# Patient Record
Sex: Male | Born: 1965 | Race: Black or African American | Hispanic: No | Marital: Married | Smoking: Never smoker
Health system: Southern US, Community
[De-identification: ages and names within clinical notes are randomized; demographics above are authoritative.]

## PROBLEM LIST (undated history)

## (undated) DIAGNOSIS — T7840XA Allergy, unspecified, initial encounter: Secondary | ICD-10-CM

## (undated) DIAGNOSIS — I10 Essential (primary) hypertension: Secondary | ICD-10-CM

## (undated) DIAGNOSIS — I4891 Unspecified atrial fibrillation: Secondary | ICD-10-CM

## (undated) DIAGNOSIS — K219 Gastro-esophageal reflux disease without esophagitis: Secondary | ICD-10-CM

## (undated) HISTORY — DX: Gastro-esophageal reflux disease without esophagitis: K21.9

## (undated) HISTORY — PX: STOMACH SURGERY: SHX791

## (undated) HISTORY — DX: Allergy, unspecified, initial encounter: T78.40XA

---

## 2000-08-23 ENCOUNTER — Emergency Department (HOSPITAL_COMMUNITY): Admission: EM | Admit: 2000-08-23 | Discharge: 2000-08-23 | Payer: Self-pay | Admitting: Emergency Medicine

## 2003-08-17 ENCOUNTER — Ambulatory Visit (HOSPITAL_COMMUNITY): Admission: RE | Admit: 2003-08-17 | Discharge: 2003-08-17 | Payer: Self-pay | Admitting: Family Medicine

## 2015-02-20 ENCOUNTER — Ambulatory Visit (INDEPENDENT_AMBULATORY_CARE_PROVIDER_SITE_OTHER): Payer: 59

## 2015-02-20 ENCOUNTER — Ambulatory Visit (INDEPENDENT_AMBULATORY_CARE_PROVIDER_SITE_OTHER): Payer: 59 | Admitting: Podiatry

## 2015-02-20 ENCOUNTER — Encounter: Payer: Self-pay | Admitting: Podiatry

## 2015-02-20 VITALS — BP 140/70 | HR 78 | Resp 13 | Ht 72.0 in | Wt 275.0 lb

## 2015-02-20 DIAGNOSIS — B079 Viral wart, unspecified: Secondary | ICD-10-CM | POA: Diagnosis not present

## 2015-02-20 DIAGNOSIS — B078 Other viral warts: Secondary | ICD-10-CM

## 2015-02-20 DIAGNOSIS — M205X1 Other deformities of toe(s) (acquired), right foot: Secondary | ICD-10-CM | POA: Diagnosis not present

## 2015-02-20 DIAGNOSIS — Q667 Congenital pes cavus: Secondary | ICD-10-CM

## 2015-02-20 DIAGNOSIS — M216X9 Other acquired deformities of unspecified foot: Secondary | ICD-10-CM

## 2015-02-20 NOTE — Progress Notes (Signed)
   Subjective:    Patient ID: Terry Delgado, male    DOB: Dec 31, 1965, 49 y.o.   MRN: [id number]  HPI    Review of Systems  HENT: Positive for congestion, hearing loss and sinus pressure.   Respiratory: Positive for shortness of breath.        Shortness of breath with increase of wt.  All other systems reviewed and are negative.      Objective:   Physical Exam        Assessment & Plan:

## 2015-02-21 NOTE — Progress Notes (Signed)
Subjective:     Patient ID: Terry Delgado, male   DOB: 1966/08/17, 49 y.o.   MRN: [id number]  HPI patient presents with lesions on the plantar aspect of the right foot on the lateral side that been painful and making it hard for him to walk. States he's tried to trim them and other modalities without relief of symptoms   Review of Systems  All other systems reviewed and are negative.      Objective:   Physical Exam  Constitutional: He is oriented to person, place, and time.  Cardiovascular: Intact distal pulses.   Musculoskeletal: Normal range of motion.  Neurological: He is oriented to person, place, and time.  Skin: Skin is warm.  Nursing note and vitals reviewed.  neurovascular status intact muscle strength adequate with range of motion subtalar and midtarsal joint within normal limits. Patient's noted to have keratotic lesion plantar aspect right foot lateral side that upon debridement shows pinpoint bleeding and pain to lateral pressure. Digital perfusion is normal and patient well oriented 3     Assessment:     Probable verruca plantaris plantar lateral aspect right foot measuring 8 x 8 mm    Plan:     H&P and x-rays reviewed showing a lesion to be on the metatarsal head. Deep debridement accomplished with chemical application tolerated well and sterile dressing applied. Explain what to do if any other issues should occur and reappoint 4 weeks

## 2015-02-27 ENCOUNTER — Ambulatory Visit: Payer: 59 | Admitting: Podiatry

## 2015-11-27 ENCOUNTER — Emergency Department (HOSPITAL_COMMUNITY): Payer: Managed Care, Other (non HMO)

## 2015-11-27 ENCOUNTER — Encounter (HOSPITAL_COMMUNITY): Payer: Self-pay | Admitting: Emergency Medicine

## 2015-11-27 ENCOUNTER — Inpatient Hospital Stay (HOSPITAL_COMMUNITY)
Admission: EM | Admit: 2015-11-27 | Discharge: 2015-12-02 | DRG: 390 | Disposition: A | Discharge status: Home / Self Care | Payer: Managed Care, Other (non HMO) | Attending: Surgery | Admitting: Surgery

## 2015-11-27 DIAGNOSIS — Z79899 Other long term (current) drug therapy: Secondary | ICD-10-CM | POA: Diagnosis not present

## 2015-11-27 DIAGNOSIS — Z880 Allergy status to penicillin: Secondary | ICD-10-CM

## 2015-11-27 DIAGNOSIS — K219 Gastro-esophageal reflux disease without esophagitis: Secondary | ICD-10-CM | POA: Diagnosis present

## 2015-11-27 DIAGNOSIS — K56609 Unspecified intestinal obstruction, unspecified as to partial versus complete obstruction: Secondary | ICD-10-CM

## 2015-11-27 DIAGNOSIS — K5669 Other intestinal obstruction: Secondary | ICD-10-CM | POA: Diagnosis present

## 2015-11-27 DIAGNOSIS — K565 Intestinal adhesions [bands] with obstruction (postprocedural) (postinfection): Principal | ICD-10-CM | POA: Diagnosis present

## 2015-11-27 LAB — URINALYSIS, ROUTINE W REFLEX MICROSCOPIC
Bilirubin Urine: NEGATIVE
Glucose, UA: NEGATIVE mg/dL
Hgb urine dipstick: NEGATIVE
Ketones, ur: NEGATIVE mg/dL
Leukocytes, UA: NEGATIVE
Nitrite: NEGATIVE
Protein, ur: NEGATIVE mg/dL
Specific Gravity, Urine: 1.021 (ref 1.005–1.030)
pH: 7 (ref 5.0–8.0)

## 2015-11-27 LAB — CREATININE, SERUM: CREATININE: 1.13 mg/dL (ref 0.61–1.24)

## 2015-11-27 LAB — LIPASE, BLOOD: Lipase: 26 U/L (ref 11–51)

## 2015-11-27 LAB — CBC
HCT: 45.9 % (ref 39.0–52.0)
Hemoglobin: 14.9 g/dL (ref 13.0–17.0)
MCH: 27.3 pg (ref 26.0–34.0)
MCHC: 32.5 g/dL (ref 30.0–36.0)
MCV: 84.2 fL (ref 78.0–100.0)
Platelets: 198 10*3/uL (ref 150–400)
RBC: 5.45 MIL/uL (ref 4.22–5.81)
RDW: 13.3 % (ref 11.5–15.5)
WBC: 10.7 10*3/uL — ABNORMAL HIGH (ref 4.0–10.5)

## 2015-11-27 LAB — COMPREHENSIVE METABOLIC PANEL
ALT: 28 U/L (ref 17–63)
AST: 24 U/L (ref 15–41)
Albumin: 4 g/dL (ref 3.5–5.0)
Alkaline Phosphatase: 92 U/L (ref 38–126)
Anion gap: 7 (ref 5–15)
BUN: 9 mg/dL (ref 6–20)
CO2: 27 mmol/L (ref 22–32)
Calcium: 9.4 mg/dL (ref 8.9–10.3)
Chloride: 104 mmol/L (ref 101–111)
Creatinine, Ser: 1.02 mg/dL (ref 0.61–1.24)
GFR calc Af Amer: >60 mL/min (ref >60)
GFR calc non Af Amer: >60 mL/min (ref >60)
Glucose, Bld: 142 mg/dL — ABNORMAL HIGH (ref 65–99)
Potassium: 4.8 mmol/L (ref 3.5–5.1)
Sodium: 138 mmol/L (ref 135–145)
Total Bilirubin: 0.9 mg/dL (ref 0.3–1.2)
Total Protein: 7.8 g/dL (ref 6.5–8.1)

## 2015-11-27 MED ORDER — SODIUM CHLORIDE 0.9 % IV SOLN
1000.0000 mL | Freq: Once | INTRAVENOUS | Status: AC
  Administered 2015-11-27: 1000 mL via INTRAVENOUS

## 2015-11-27 MED ORDER — ONDANSETRON HCL 4 MG/2ML IJ SOLN
4.0000 mg | Freq: Four times a day (QID) | INTRAMUSCULAR | Status: DC | PRN
  Administered 2015-11-27: 4 mg via INTRAVENOUS

## 2015-11-27 MED ORDER — ONDANSETRON HCL 4 MG/2ML IJ SOLN
4.0000 mg | Freq: Four times a day (QID) | INTRAMUSCULAR | Status: DC | PRN
Start: 2015-11-27 — End: 2015-11-27
  Administered 2015-11-27: 4 mg via INTRAVENOUS
  Filled 2015-11-27 (×2): qty 2

## 2015-11-27 MED ORDER — HEPARIN SODIUM (PORCINE) 5000 UNIT/ML IJ SOLN
5000.0000 [IU] | Freq: Three times a day (TID) | INTRAMUSCULAR | Status: DC
  Filled 2015-11-27 (×3): qty 1

## 2015-11-27 MED ORDER — IOHEXOL 300 MG/ML  SOLN
50.0000 mL | Freq: Once | INTRAMUSCULAR | Status: AC | PRN
  Administered 2015-11-27: 50 mL via ORAL

## 2015-11-27 MED ORDER — DEXTROSE-NACL 5-0.9 % IV SOLN
INTRAVENOUS | Status: DC
  Administered 2015-11-27 – 2015-11-29 (×3): via INTRAVENOUS

## 2015-11-27 MED ORDER — DIPHENHYDRAMINE HCL 25 MG PO CAPS: 25.0000 mg | ORAL_CAPSULE | Freq: Four times a day (QID) | ORAL | Status: DC | PRN

## 2015-11-27 MED ORDER — IOPAMIDOL (ISOVUE-300) INJECTION 61%
100.0000 mL | Freq: Once | INTRAVENOUS | Status: AC | PRN
  Administered 2015-11-27: 100 mL via INTRAVENOUS

## 2015-11-27 MED ORDER — HYDROMORPHONE HCL 1 MG/ML IJ SOLN
1.0000 mg | Freq: Once | INTRAMUSCULAR | Status: AC
  Administered 2015-11-27: 1 mg via INTRAVENOUS
  Filled 2015-11-27: qty 1

## 2015-11-27 MED ORDER — PANTOPRAZOLE SODIUM 40 MG IV SOLR
40.0000 mg | Freq: Two times a day (BID) | INTRAVENOUS | Status: DC
  Administered 2015-11-27 – 2015-12-01 (×10): 40 mg via INTRAVENOUS
  Filled 2015-11-27 (×12): qty 40

## 2015-11-27 MED ORDER — MORPHINE SULFATE (PF) 2 MG/ML IV SOLN
2.0000 mg | INTRAVENOUS | Status: DC | PRN
  Administered 2015-11-27: 2 mg via INTRAVENOUS
  Administered 2015-11-27 (×2): 4 mg via INTRAVENOUS
  Administered 2015-11-28: 2 mg via INTRAVENOUS
  Filled 2015-11-27 (×3): qty 2
  Filled 2015-11-27: qty 1

## 2015-11-27 MED ORDER — ENOXAPARIN SODIUM 60 MG/0.6ML ~~LOC~~ SOLN
60.0000 mg | SUBCUTANEOUS | Status: DC
Start: 2015-11-27 — End: 2015-11-30
  Administered 2015-11-27 – 2015-11-29 (×3): 60 mg via SUBCUTANEOUS
  Filled 2015-11-27 (×5): qty 0.6

## 2015-11-27 MED ORDER — SODIUM CHLORIDE 0.9 % IV SOLN
1000.0000 mL | INTRAVENOUS | Status: DC
  Administered 2015-11-27: 1000 mL via INTRAVENOUS

## 2015-11-27 MED ORDER — IOHEXOL 300 MG/ML  SOLN: 100.0000 mL | Freq: Once | INTRAMUSCULAR | Status: DC | PRN

## 2015-11-27 MED ORDER — DIPHENHYDRAMINE HCL 50 MG/ML IJ SOLN: 25.0000 mg | Freq: Four times a day (QID) | INTRAMUSCULAR | Status: DC | PRN

## 2015-11-27 MED ORDER — ONDANSETRON 4 MG PO TBDP: 4.0000 mg | ORAL_TABLET | Freq: Four times a day (QID) | ORAL | Status: DC | PRN

## 2015-11-27 NOTE — ED Notes (Signed)
FIRST ATTEMPT TO CALL REPORT TO 1513-1.

## 2015-11-27 NOTE — H&P (Signed)
Terry Delgado is an 50 y.o. male.   Chief Complaint: crampy lower abdominal pain HPI: Pt presents to the ED with abdominal pain last PM.  He reported to the nurse he was on a 7 day cleanse that started on Saturday.  He started having pain last PM around 7 PM, he thought it was GERD.  His pain got worse around 9 PM.  He ate a smoothy, and then some soup with noodles.   No nausea, vomiting, fever or chills.  He has had similar episodes in the past.  Best recollection 2 over the last 20 years.    Work up show he is afebrile, VSS, labs are all normal.  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  He had some kind of abdominal surgery as an infant.   Noodles from last night are in the cannister after NG placement by the ED.  He has about 500 total currently in cannister.  We are ask to see    Past Medical History  Diagnosis Date   Probable prior SBO's  x 2 over the last 20 years  . GERD (gastroesophageal reflux disease)   . Allergy     Past Surgical History  Procedure Laterality Date  . Stomach surgery [place], [place]; as an infant       infant    History reviewed. No pertinent family history. Social History:  reports that he has never smoked. He does not have any smokeless tobacco history on file. He reports that he does not use illicit drugs. His alcohol history is not on file.  Allergies:  Allergies  Allergen Reactions  . Penicillins Rash    Has patient had a PCN reaction causing immediate rash, facial/tongue/throat swelling, SOB or lightheadedness with hypotension: unknown Has patient had a PCN reaction causing severe rash involving mucus membranes or skin necrosis: No Has patient had a PCN reaction that required hospitalization No Has patient had a PCN reaction occurring within the last 10 years: No If all of the above answers are "NO", then may proceed with Cephalosporin use.    Prior to Admission medications   Medication Sig Start Date End Date Taking? Authorizing Provider   calcium carbonate (TUMS EX) 750 MG chewable tablet Chew 2 tablets by mouth 2 (two) times daily.   Yes Historical Provider, MD  fexofenadine (ALLEGRA) 30 MG tablet Take 30 mg by mouth daily as needed (seasonal allergies). Reported on 11/27/2015   Yes Historical Provider, MD  fluticasone (FLONASE) 50 MCG/ACT nasal spray Place 1 spray into both nostrils daily as needed for allergies or rhinitis.   Yes Historical Provider, MD  ibuprofen (ADVIL,MOTRIN) 200 MG tablet Take 400 mg by mouth daily as needed for mild pain.   Yes Historical Provider, MD  ranitidine (ZANTAC) 75 MG tablet Take 75 mg by mouth 2 (two) times daily.   Yes Historical Provider, MD     Results for orders placed or performed during the hospital encounter of 11/27/15 (from the past 48 hour(s))  Lipase, blood     Status: None   Collection Time: 11/27/15  7:52 AM  Result Value Ref Range   Lipase 26 11 - 51 U/L  Comprehensive metabolic panel     Status: Abnormal   Collection Time: 11/27/15  7:52 AM  Result Value Ref Range   Sodium 138 135 - 145 mmol/L   Potassium 4.8 3.5 - 5.1 mmol/L   Chloride 104 101 - 111 mmol/L   CO2 27 22 - 32  mmol/L   Glucose, Bld 142 (H) 65 - 99 mg/dL   BUN 9 6 - 20 mg/dL   Creatinine, Ser 1.02 0.61 - 1.24 mg/dL   Calcium 9.4 8.9 - 10.3 mg/dL   Total Protein 7.8 6.5 - 8.1 g/dL   Albumin 4.0 3.5 - 5.0 g/dL   AST 24 15 - 41 U/L   ALT 28 17 - 63 U/L   Alkaline Phosphatase 92 38 - 126 U/L   Total Bilirubin 0.9 0.3 - 1.2 mg/dL   GFR calc non Af Amer >60 >60 mL/min   GFR calc Af Amer >60 >60 mL/min    Comment: (NOTE) The eGFR has been calculated using the CKD EPI equation. This calculation has not been validated in all clinical situations. eGFR's persistently <60 mL/min signify possible Chronic Kidney Disease.    Anion gap 7 5 - 15  CBC     Status: Abnormal   Collection Time: 11/27/15  7:52 AM  Result Value Ref Range   WBC 10.7 (H) 4.0 - 10.5 K/uL   RBC 5.45 4.22 - 5.81 MIL/uL   Hemoglobin 14.9  13.0 - 17.0 g/dL   HCT 45.9 39.0 - 52.0 %   MCV 84.2 78.0 - 100.0 fL   MCH 27.3 26.0 - 34.0 pg   MCHC 32.5 30.0 - 36.0 g/dL   RDW 13.3 11.5 - 15.5 %   Platelets 198 150 - 400 K/uL  Urinalysis, Routine w reflex microscopic (not at [facility])     Status: None   Collection Time: 11/27/15  8:59 AM  Result Value Ref Range   Color, Urine YELLOW YELLOW   APPearance CLEAR CLEAR   Specific Gravity, Urine 1.021 1.005 - 1.030   pH 7.0 5.0 - 8.0   Glucose, UA NEGATIVE NEGATIVE mg/dL   Hgb urine dipstick NEGATIVE NEGATIVE   Bilirubin Urine NEGATIVE NEGATIVE   Ketones, ur NEGATIVE NEGATIVE mg/dL   Protein, ur NEGATIVE NEGATIVE mg/dL   Nitrite NEGATIVE NEGATIVE   Leukocytes, UA NEGATIVE NEGATIVE    Comment: MICROSCOPIC NOT DONE ON URINES WITH NEGATIVE PROTEIN, BLOOD, LEUKOCYTES, NITRITE, OR GLUCOSE <1000 mg/dL.   Ct Abdomen Pelvis W Contrast  11/27/2015  CLINICAL DATA:  Lower abdominal pain. EXAM: CT ABDOMEN AND PELVIS WITH CONTRAST TECHNIQUE: Multidetector CT imaging of the abdomen and pelvis was performed using the standard protocol following bolus administration of intravenous contrast. CONTRAST:  1 OMNIPAQUE IOHEXOL 300 MG/ML SOLN, 166m ISOVUE-300 IOPAMIDOL (ISOVUE-300) INJECTION 61% COMPARISON:  None. FINDINGS: Lower chest:  Normal. Hepatobiliary: Normal. Pancreas: Normal. Spleen: Normal. Adrenals/Urinary Tract: 7 mm benign appearing cyst in the upper pole of the left kidney. Otherwise normal adrenal glands, kidneys, and bladder. 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 There is a small bowel obstruction right lower quadrant. There are 2 transition zones, one on image 52 and one on image 66, both on series 2. Vascular/Lymphatic: Normal. Reproductive: Possible varicocele in the left side of the scrotum. Otherwise normal. Other: No free air or free fluid. Musculoskeletal: No acute abnormality. IMPRESSION: Small bowel obstruction. No mass. This is probably secondary to  adhesions. 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 Electronically Signed   By: JLorriane ShireM.D.   On: 11/27/2015 10:49    Review of Systems  Constitutional: Negative.   HENT: Positive for hearing loss (certain tones).   Eyes: Negative.   Respiratory: Negative.   Cardiovascular: Negative.   Gastrointestinal: Positive for heartburn and abdominal pain. Negative for diarrhea, constipation, blood in stool  and melena.       Stool yesterday was soft, but not having diarrhea.  Genitourinary: Negative.   Musculoskeletal: Negative.   Skin: Negative.   Neurological: Negative.   Endo/Heme/Allergies: Negative.   Psychiatric/Behavioral: Negative.     Blood pressure 123/91, pulse 86, temperature 98.5 F (36.9 C), temperature source Oral, resp. rate 19, height 6' (1.829 m), weight 122.018 kg (269 lb), SpO2 96 %. Physical Exam  Constitutional: He is oriented to person, place, and time. He appears well-developed and well-nourished. He appears distressed (still having some gaging with the NG).  HENT:  Head: Normocephalic and atraumatic.  Nose: Nose normal.  Eyes: Right eye exhibits no discharge. Left eye exhibits no discharge. No scleral icterus.  Neck: Normal range of motion. Neck supple. No JVD present. No tracheal deviation present. No thyromegaly present.  Cardiovascular: Normal rate, regular rhythm, normal heart sounds and intact distal pulses.   No murmur heard. Respiratory: Effort normal and breath sounds normal. No respiratory distress. He has no wheezes. He has no rales. He exhibits no tenderness.  Faint midline abdominal scar.  GI: Soft. He exhibits distension. He exhibits no mass. There is no tenderness. There is no rebound and no guarding.  No bowel sounds  Musculoskeletal: He exhibits no edema or tenderness.  Lymphadenopathy:    He has no cervical adenopathy.  Neurological: He is alert and oriented to person, place, and time. No cranial nerve deficit.  Skin: Skin is warm and dry.  No rash noted. He is not diaphoretic. No erythema. No pallor.  Psychiatric: He has a normal mood and affect. His behavior is normal. Judgment and thought content normal.     Assessment/Plan SBO Hx of prior SBO/GI surgery age 25 months, [place], age 23 months. GERD  Plan:  NG decompression, hydration, and bowel rest.  Recheck labs and films in AM.     Lacy Sofia, PA-C 11/27/2015, 11:24 AM

## 2015-11-27 NOTE — ED Provider Notes (Signed)
CSN: [id number]     Arrival date & time 11/27/15  0549 History   First MD Initiated Contact with Patient 11/27/15 [phone]     Chief Complaint  Patient presents with  . Abdominal Pain    bilateral, lower      HPI Patient presents to the emergency department complaints of crampy lower abdominal pain since last night.  He states the pain seems to fluctuate.  Pain is moderate to severe in severity.  He reports nausea without vomiting.  Denies diarrhea or chills.  Denies fever.  Patient does report "stomach surgery" when he was a child.  He does not remember what surgery he had.  He states he is intermittently had symptoms like this over the past several years but usually resolves on its own.  Today's pain was more severe thus he came to the ER for evaluation.   Past Medical History  Diagnosis Date  . GERD (gastroesophageal reflux disease)   . Allergy    Past Surgical History  Procedure Laterality Date  . Stomach surgery      infant   History reviewed. No pertinent family history. Social History  Substance Use Topics  . Smoking status: Never Smoker   . Smokeless tobacco: None  . Alcohol Use: None     Comment: occ    Review of Systems  All other systems reviewed and are negative.     Allergies  Penicillins  Home Medications   Prior to Admission medications   Medication Sig Start Date End Date Taking? Authorizing Provider  fexofenadine (ALLEGRA) 30 MG tablet Take 30 mg by mouth daily.    Historical Provider, MD  IBUPROFEN PO Take by mouth.    Historical Provider, MD  omeprazole (PRILOSEC OTC) 20 MG tablet Take 20 mg by mouth daily.    Historical Provider, MD   BP 123/91 mmHg  Pulse 86  Temp(Src) 98.5 F (36.9 C) (Oral)  Resp 19  Ht 6' (1.829 m)  Wt 269 lb (122.018 kg)  BMI 36.48 kg/m2  SpO2 96% Physical Exam  Constitutional: He is oriented to person, place, and time. He appears well-developed and well-nourished.  HENT:  Head: Normocephalic and atraumatic.  Eyes:  EOM are normal.  Neck: Normal range of motion.  Cardiovascular: Normal rate, regular rhythm, normal heart sounds and intact distal pulses.   Pulmonary/Chest: Effort normal and breath sounds normal. No respiratory distress.  Abdominal: Soft. He exhibits no distension.  Lower abdominal tenderness.  No guarding or rebound.  Musculoskeletal: Normal range of motion.  Neurological: He is alert and oriented to person, place, and time.  Skin: Skin is warm and dry.  Psychiatric: He has a normal mood and affect. Judgment normal.  Nursing note and vitals reviewed.   ED Course  Procedures (including critical care time) Labs Review Labs Reviewed  COMPREHENSIVE METABOLIC PANEL - Abnormal; Notable for the following:    Glucose, Bld 142 (*)    All other components within normal limits  CBC - Abnormal; Notable for the following:    WBC 10.7 (*)    All other components within normal limits  LIPASE, BLOOD  URINALYSIS, ROUTINE W REFLEX MICROSCOPIC (NOT AT [facility])    Imaging Review Ct Abdomen Pelvis W Contrast  11/27/2015  CLINICAL DATA:  Lower abdominal pain. EXAM: CT ABDOMEN AND PELVIS WITH CONTRAST TECHNIQUE: Multidetector CT imaging of the abdomen and pelvis was performed using the standard protocol following bolus administration of intravenous contrast. CONTRAST:  1 OMNIPAQUE IOHEXOL 300 MG/ML SOLN,  ISOVUE-300 IOPAMIDOL (ISOVUE-300) INJECTION 61% COMPARISON:  None. FINDINGS: Lower chest:  Normal. Hepatobiliary: Normal. Pancreas: Normal. Spleen: Normal. Adrenals/Urinary Tract: 7 mm benign appearing cyst in the upper pole of the left kidney. Otherwise normal adrenal glands, kidneys, and bladder. 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 There is a small bowel obstruction right lower quadrant. There are 2 transition zones, one on image 52 and one on image 66, both on series 2. Vascular/Lymphatic: Normal. Reproductive: Possible varicocele in the left side of the scrotum.  Otherwise normal. Other: No free air or free fluid. Musculoskeletal: No acute abnormality. IMPRESSION: Small bowel obstruction. No mass. This is probably secondary to adhesions. 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 Electronically Signed   By: Francene BoyersJames  Maxwell M.D.   On: 11/27/2015 10:49   I have personally reviewed and evaluated these images and lab results as part of my medical decision-making.   EKG Interpretation None      MDM   Final diagnoses:  None    Patient with small bowel obstruction.  NG to be placed at this time.  Ongoing IV hydration.  General surgery consultation and likely admission.  No peritonitis on exam.  White blood cell count 10.7.    Azalia BilisKevin Semaj Kham, MD 11/27/15 1116

## 2015-11-27 NOTE — Progress Notes (Signed)
Brief Pharmacy note: Lovenox for VTE prophylaxis  TBW: 122 kg,  Lovenox dose 0.5mg /kg q24hr  Lovenox 60mg  SQ daily CBC wnl, no anti-coagulants prior to admission  Otho BellowsGreen, Annesha Delgreco L PharmD Pager [phone] 11/27/2015, 12:50 PM

## 2015-11-27 NOTE — ED Notes (Signed)
Patient with bilateral low abd pain, onset last night. Pain is described as "gripping" and intermittent. Patient started a 7 day cleanse on Saturday. Last BM 05/28/2016 at 0900. Patient denies nausea and vomiting, denies fever or chills.

## 2015-11-28 ENCOUNTER — Inpatient Hospital Stay (HOSPITAL_COMMUNITY): Payer: Managed Care, Other (non HMO)

## 2015-11-28 LAB — BASIC METABOLIC PANEL
Anion gap: 6 (ref 5–15)
BUN: 9 mg/dL (ref 6–20)
CO2: 24 mmol/L (ref 22–32)
CREATININE: 1.07 mg/dL (ref 0.61–1.24)
Calcium: 8.5 mg/dL — ABNORMAL LOW (ref 8.9–10.3)
Chloride: 107 mmol/L (ref 101–111)
GFR calc Af Amer: >60 mL/min (ref >60)
GLUCOSE: 144 mg/dL — AB (ref 65–99)
POTASSIUM: 3.9 mmol/L (ref 3.5–5.1)
Sodium: 137 mmol/L (ref 135–145)

## 2015-11-28 LAB — CBC
HEMATOCRIT: 44.4 % (ref 39.0–52.0)
Hemoglobin: 14.5 g/dL (ref 13.0–17.0)
MCH: 27.6 pg (ref 26.0–34.0)
MCHC: 32.7 g/dL (ref 30.0–36.0)
MCV: 84.4 fL (ref 78.0–100.0)
PLATELETS: 196 10*3/uL (ref 150–400)
RBC: 5.26 MIL/uL (ref 4.22–5.81)
RDW: 13.4 % (ref 11.5–15.5)
WBC: 10.8 10*3/uL — ABNORMAL HIGH (ref 4.0–10.5)

## 2015-11-28 MED ORDER — SODIUM CHLORIDE 0.9 % IV BOLUS (SEPSIS)
500.0000 mL | Freq: Once | INTRAVENOUS | Status: AC
  Administered 2015-11-28: 500 mL via INTRAVENOUS

## 2015-11-28 MED ORDER — PHENOL 1.4 % MT LIQD
2.0000 | OROMUCOSAL | Status: DC | PRN
  Filled 2015-11-28: qty 177

## 2015-11-28 NOTE — Progress Notes (Signed)
Subjective: He feels better, some flatus.  He has been up walking also.    Objective: Vital signs in last 24 hours: Temp:  [97.7 F (36.5 C)-98.7 F (37.1 C)] 98.4 F (36.9 C) (03/21 0659) Pulse Rate:  [80-92] 81 (03/21 0659) Resp:  [16-20] 18 (03/21 0659) BP: (132-146)/(77-93) 138/89 mmHg (03/21 0659) SpO2:  [95 %-97 %] 97 % (03/21 0659) Weight:  [122.018 kg (269 lb)] 122.018 kg (269 lb) (03/20 1729) Last BM Date: 11/26/15 825 from NG  NPO Voided x 1 2 liters in 1.2 liters positive  Afebrile, VSS Labs stable Xray shows contrast and air in the colon, but still has some large loops of SB distension  Intake/Output from previous day: 03/20 0701 - 03/21 0700 In: 2072.9 [I.V.:2072.9] Out: 825 [Emesis/NG output:825] Intake/Output this shift:    General appearance: alert, cooperative and no distress Resp: clear to auscultation bilaterally GI: soft, large abdomen few BS, some flatus, no pain this AM  Lab Results:   Recent Labs  11/27/15 0752 11/28/15 0604  WBC 10.7* 10.8*  HGB 14.9 14.5  HCT 45.9 44.4  PLT 198 196    BMET  Recent Labs  11/27/15 0752 11/28/15 0604  NA 138 137  K 4.8 3.9  CL 104 107  CO2 27 24  GLUCOSE 142* 144*  BUN 9 9  CREATININE 1.13  1.02 1.07  CALCIUM 9.4 8.5*   PT/INR No results for input(s): LABPROT, INR in the last 72 hours.   Recent Labs Lab 11/27/15 0752  AST 24  ALT 28  ALKPHOS 92  BILITOT 0.9  PROT 7.8  ALBUMIN 4.0     Lipase     Component Value Date/Time   LIPASE 26 11/27/2015 0752     Studies/Results: Ct Abdomen Pelvis W Contrast  11/27/2015  CLINICAL DATA:  Lower abdominal pain. EXAM: CT ABDOMEN AND PELVIS WITH CONTRAST TECHNIQUE: Multidetector CT imaging of the abdomen and pelvis was performed using the standard protocol following bolus administration of intravenous contrast. CONTRAST:  1 OMNIPAQUE IOHEXOL 300 MG/ML SOLN, ISOVUE-300 IOPAMIDOL (ISOVUE-300) INJECTION 61% COMPARISON:  None. FINDINGS:  Lower chest:  Normal. Hepatobiliary: Normal. Pancreas: Normal. Spleen: Normal. Adrenals/Urinary Tract: 7 mm benign appearing cyst in the upper pole of the left kidney. Otherwise normal adrenal glands, kidneys, and bladder. 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 There is a small bowel obstruction right lower quadrant. There are 2 transition zones, one on image 52 and one on image 66, both on series 2. Vascular/Lymphatic: Normal. Reproductive: Possible varicocele in the left side of the scrotum. Otherwise normal. Other: No free air or free fluid. Musculoskeletal: No acute abnormality. IMPRESSION: Small bowel obstruction. No mass. This is probably secondary to adhesions. 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 Electronically Signed   By: Francene Boyers M.D.   On: 11/27/2015 10:49   Dg Abd 2 Views  11/28/2015  CLINICAL DATA:  Followup small bowel obstruction. Patient states feeling better. EXAM: ABDOMEN - 2 VIEW COMPARISON:  CT 11/27/2015 FINDINGS: Nasogastric tube has tip and side-port over the stomach in the left upper quadrant. Air, contrast and stool are present throughout the colon. There are persistent air-filled dilated small bowel loops in the right abdomen measuring up to 4.4 cm in diameter. No evidence of free peritoneal air. There are scattered air-fluid levels associated with the dilated small bowel loops in the right abdomen on the left side down decubitus film. There are degenerative changes of the spine and hips.  Pelvic phleboliths are present. IMPRESSION: Persistent from air-filled dilated small bowel loops in the right abdomen measuring up to 4.4 cm in diameter. No free air. Air throughout the colon. Electronically Signed   By: Elberta Fortisaniel  Boyle M.D.   On: 11/28/2015 08:42    Medications: . enoxaparin (LOVENOX) injection  60 mg Subcutaneous Q24H  . pantoprazole (PROTONIX) IV  40 mg Intravenous Q12H   . sodium chloride Stopped (11/27/15 1320)  . dextrose 5 % and  0.9% NaCl 125 mL/hr at 11/27/15 1325    Assessment/Plan SBO Hx of prior SBO/GI surgery age 376 months, [place], age 166 months. GERD Antibiotics:  None DVT:  Lovenox/SCD    Plan:  Films this Am shows contrast and air in the colon, but still has dilated loops of SB.  Continue suction, bowel rest and hydration.  i am going to give him some extra fluid this AM.  Follow labs.    LOS: 1 day    Niyla Marone 11/28/2015

## 2015-11-29 ENCOUNTER — Inpatient Hospital Stay (HOSPITAL_COMMUNITY): Payer: Managed Care, Other (non HMO)

## 2015-11-29 LAB — CBC
HCT: 40.3 % (ref 39.0–52.0)
Hemoglobin: 13.3 g/dL (ref 13.0–17.0)
MCH: 27.9 pg (ref 26.0–34.0)
MCHC: 33 g/dL (ref 30.0–36.0)
MCV: 84.7 fL (ref 78.0–100.0)
PLATELETS: 176 10*3/uL (ref 150–400)
RBC: 4.76 MIL/uL (ref 4.22–5.81)
RDW: 13.4 % (ref 11.5–15.5)
WBC: 7.1 10*3/uL (ref 4.0–10.5)

## 2015-11-29 LAB — BASIC METABOLIC PANEL
Anion gap: 5 (ref 5–15)
BUN: 9 mg/dL (ref 6–20)
CO2: 25 mmol/L (ref 22–32)
Calcium: 8.2 mg/dL — ABNORMAL LOW (ref 8.9–10.3)
Chloride: 109 mmol/L (ref 101–111)
Creatinine, Ser: 1.17 mg/dL (ref 0.61–1.24)
GFR calc Af Amer: >60 mL/min (ref >60)
GLUCOSE: 121 mg/dL — AB (ref 65–99)
POTASSIUM: 3.6 mmol/L (ref 3.5–5.1)
Sodium: 139 mmol/L (ref 135–145)

## 2015-11-29 MED ORDER — KCL IN DEXTROSE-NACL 40-5-0.9 MEQ/L-%-% IV SOLN
INTRAVENOUS | Status: DC
  Administered 2015-11-29: 15:00:00 via INTRAVENOUS
  Filled 2015-11-29 (×3): qty 1000

## 2015-11-29 MED ORDER — HYDRALAZINE HCL 20 MG/ML IJ SOLN
10.0000 mg | Freq: Four times a day (QID) | INTRAMUSCULAR | Status: DC | PRN
  Administered 2015-11-29 – 2015-12-01 (×2): 10 mg via INTRAVENOUS
  Filled 2015-11-29 (×3): qty 1

## 2015-11-29 NOTE — Progress Notes (Signed)
Subjective: No pain he feels better.  Had a small BM this AM.    Objective: Vital signs in last 24 hours: Temp:  [98.3 F (36.8 C)-98.8 F (37.1 C)] 98.8 F (37.1 C) (03/22 0622) Pulse Rate:  [78-88] 88 (03/22 0622) Resp:  [18] 18 (03/22 0622) BP: (134-142)/(81-88) 142/88 mmHg (03/22 0622) SpO2:  [97 %-98 %] 97 % (03/22 0622) Last BM Date: 11/29/15 650 from the NG yesterday Afebrile, VSS Labs OK Film today:  Enteric tube terminates in the body of the stomach. There are multiple persistently moderately dilated small bowel loops in the right abdomen with air-fluid levels measuring up to the 5.5 cm, previously 4.6 cm, slightly worsened. Retained oral contrast is seen throughout the left-sided colon. No evidence of pneumatosis or pneumoperitoneum. Intake/Output from previous day: 03/21 0701 - 03/22 0700 In: 3020.8 [I.V.:3020.8] Out: 650 [Emesis/NG output:650] Intake/Output this shift:    General appearance: alert, cooperative and no distress GI: soft, non-tender; bowel sounds normal; no masses,  no organomegaly  Lab Results:   Recent Labs  11/28/15 0604 11/29/15 0538  WBC 10.8* 7.1  HGB 14.5 13.3  HCT 44.4 40.3  PLT 196 176    BMET  Recent Labs  11/28/15 0604 11/29/15 0538  NA 137 139  K 3.9 3.6  CL 107 109  CO2 24 25  GLUCOSE 144* 121*  BUN 9 9  CREATININE 1.07 1.17  CALCIUM 8.5* 8.2*   PT/INR No results for input(s): LABPROT, INR in the last 72 hours.   Recent Labs Lab 11/27/15 0752  AST 24  ALT 28  ALKPHOS 92  BILITOT 0.9  PROT 7.8  ALBUMIN 4.0     Lipase     Component Value Date/Time   LIPASE 26 11/27/2015 0752     Studies/Results: Dg Abd 2 Views  11/29/2015  CLINICAL DATA:  Follow-up small bowel obstruction EXAM: ABDOMEN - 2 VIEW COMPARISON:  11/28/2015 abdominal radiograph FINDINGS: Enteric tube terminates in the body of the stomach. There are multiple persistently moderately dilated small bowel loops in the right abdomen with  air-fluid levels measuring up to the 5.5 cm, previously 4.6 cm, slightly worsened. Retained oral contrast is seen throughout the left-sided colon. No evidence of pneumatosis or pneumoperitoneum. IMPRESSION: Persistent partial distal small bowel obstruction, which radiographically appears slightly worsened. 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 Electronically Signed   By: Delbert PhenixJason A Poff M.D.   On: 11/29/2015 09:56   Dg Abd 2 Views  11/28/2015  CLINICAL DATA:  Followup small bowel obstruction. Patient states feeling better. EXAM: ABDOMEN - 2 VIEW COMPARISON:  CT 11/27/2015 FINDINGS: Nasogastric tube has tip and side-port over the stomach in the left upper quadrant. Air, contrast and stool are present throughout the colon. There are persistent air-filled dilated small bowel loops in the right abdomen measuring up to 4.4 cm in diameter. No evidence of free peritoneal air. There are scattered air-fluid levels associated with the dilated small bowel loops in the right abdomen on the left side down decubitus film. There are degenerative changes of the spine and hips. Pelvic phleboliths are present. IMPRESSION: Persistent from air-filled dilated small bowel loops in the right abdomen measuring up to 4.4 cm in diameter. No free air. Air throughout the colon. Electronically Signed   By: Elberta Fortisaniel  Boyle M.D.   On: 11/28/2015 08:42    Medications: . enoxaparin (LOVENOX) injection  60 mg Subcutaneous Q24H  . pantoprazole (PROTONIX) IV  40 mg Intravenous Q12H   .  sodium chloride Stopped (11/27/15 1320)  . dextrose 5 % and 0.9% NaCl 125 mL/hr at 11/29/15 1610   Assessment/Plan SBO Hx of prior SBO/GI surgery age 83 months, [place], age 61 months. GERD Antibiotics: None DVT: Lovenox/SCD    Plan:  I am concerned about the increased size of the SB despite BM and good NG suction, continue suction for now and review with Dr. Gerrit Friends.  Add K+ to IV.  Recheck labs and  films in AM.  LOS: 2 days    Terry Delgado 11/29/2015

## 2015-11-30 ENCOUNTER — Inpatient Hospital Stay (HOSPITAL_COMMUNITY): Payer: Managed Care, Other (non HMO)

## 2015-11-30 LAB — CBC
HEMATOCRIT: 40 % (ref 39.0–52.0)
HEMOGLOBIN: 14.1 g/dL (ref 13.0–17.0)
MCH: 28.1 pg (ref 26.0–34.0)
MCHC: 35.3 g/dL (ref 30.0–36.0)
MCV: 79.7 fL (ref 78.0–100.0)
PLATELETS: 170 10*3/uL (ref 150–400)
RBC: 5.02 MIL/uL (ref 4.22–5.81)
RDW: 12.9 % (ref 11.5–15.5)
WBC: 7.8 10*3/uL (ref 4.0–10.5)

## 2015-11-30 LAB — BASIC METABOLIC PANEL
Anion gap: 8 (ref 5–15)
BUN: 8 mg/dL (ref 6–20)
CHLORIDE: 106 mmol/L (ref 101–111)
CO2: 24 mmol/L (ref 22–32)
Calcium: 8.7 mg/dL — ABNORMAL LOW (ref 8.9–10.3)
Creatinine, Ser: 1.03 mg/dL (ref 0.61–1.24)
GFR calc Af Amer: >60 mL/min (ref >60)
GFR calc non Af Amer: >60 mL/min (ref >60)
GLUCOSE: 92 mg/dL (ref 65–99)
POTASSIUM: 3.6 mmol/L (ref 3.5–5.1)
Sodium: 138 mmol/L (ref 135–145)

## 2015-11-30 MED ORDER — KCL IN DEXTROSE-NACL 20-5-0.45 MEQ/L-%-% IV SOLN
INTRAVENOUS | Status: DC
  Administered 2015-11-30 – 2015-12-02 (×3): via INTRAVENOUS
  Filled 2015-11-30 (×3): qty 1000

## 2015-11-30 NOTE — Progress Notes (Signed)
Patient ID: Terry Delgado, male   DOB: 1966/03/09, 50 y.o.   MRN: [id number]  General Surgery [facility]  Subjective: Patient up in chair, no complaints.  BM yesterday morning.  Passing flatus.  Abdomen still "tight" but improved.  Wants to eat.  Objective: Vital signs in last 24 hours: Temp:  [98.2 F (36.8 C)-98.7 F (37.1 C)] 98.2 F (36.8 C) 12/12/22 0800) Pulse Rate:  [78-86] 86 Dec 12, 2022 0800) Resp:  [18-19] 18 12/12/2022 0800) BP: (131-168)/(81-97) 131/81 mmHg December 12, 2022 0800) SpO2:  [98 %-99 %] 98 % 12/12/2022 0800) Last BM Date: 11/29/15  Intake/Output from previous day: 03/22 0701 - December 12, 2022 0700 In: 1129.6 [I.V.:1129.6] Out: 750 [Emesis/NG output:750] Intake/Output this shift:    Physical Exam: HEENT - sclerae clear, mucous membranes moist Abdomen - softer, mild distension; BS present; non-tender Ext - no edema, non-tender Neuro - alert & oriented, no focal deficits  Lab Results:   Recent Labs  11/29/15 0538 2015/12/12 0530  WBC 7.1 7.8  HGB 13.3 14.1  HCT 40.3 40.0  PLT 176 170   BMET  Recent Labs  11/29/15 0538 12/12/15 0530  NA 139 138  K 3.6 3.6  CL 109 106  CO2 25 24  GLUCOSE 121* 92  BUN 9 8  CREATININE 1.17 1.03  CALCIUM 8.2* 8.7*   PT/INR No results for input(s): LABPROT, INR in the last 72 hours. Comprehensive Metabolic Panel:    Component Value Date/Time   NA 138 12-Dec-2015 0530   NA 139 11/29/2015 0538   K 3.6 2015/12/12 0530   K 3.6 11/29/2015 0538   CL 106 12-12-15 0530   CL 109 11/29/2015 0538   CO2 24 2015-12-12 0530   CO2 25 11/29/2015 0538   BUN 8 12-Dec-2015 0530   BUN 9 11/29/2015 0538   CREATININE 1.03 Dec 12, 2015 0530   CREATININE 1.17 11/29/2015 0538   GLUCOSE 92 2015/12/12 0530   GLUCOSE 121* 11/29/2015 0538   CALCIUM 8.7* 2015-12-12 0530   CALCIUM 8.2* 11/29/2015 0538   AST 24 11/27/2015 0752   ALT 28 11/27/2015 0752   ALKPHOS 92 11/27/2015 0752   BILITOT 0.9 11/27/2015 0752   PROT 7.8 11/27/2015 0752   ALBUMIN 4.0 11/27/2015 0752    Studies/Results: Dg Abd 2 Views  2015-12-12  CLINICAL DATA:  Small bowel obstruction EXAM: ABDOMEN - 2 VIEW COMPARISON:  11/29/2015 FINDINGS: Enteric tube terminates in the mid gastric body. Known malrotation. Dilated loops of small bowel in the right mid abdomen. Contrast within colon in the left mid abdomen. Overall, this appearance is similar to the prior, and remains compatible with partial small bowel obstruction. IMPRESSION: Stable partial small bowel obstruction. Electronically Signed   By: Charline Bills M.D.   On: December 12, 2015 08:50   Dg Abd 2 Views  11/29/2015  CLINICAL DATA:  Follow-up small bowel obstruction EXAM: ABDOMEN - 2 VIEW COMPARISON:  11/28/2015 abdominal radiograph FINDINGS: Enteric tube terminates in the body of the stomach. There are multiple persistently moderately dilated small bowel loops in the right abdomen with air-fluid levels measuring up to the 5.5 cm, previously 4.6 cm, slightly worsened. Retained oral contrast is seen throughout the left-sided colon. No evidence of pneumatosis or pneumoperitoneum. IMPRESSION: Persistent partial distal small bowel obstruction, which radiographically appears slightly worsened. 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 Electronically Signed   By: Delbert Phenix M.D.   On: 11/29/2015 09:56    Assessment & Plans: Small bowel obstruction likely secondary to adhesions,  anatomic variant  AXR improved but not yet normal; contrast in colon  Discussed possible operative intervention if clinically fails diet  Will remove NG this AM and begin clear liquid diet  Continue to monitor closely - may need laparotomy if unable to tolerate diet  Velora Hecklerodd M. Yoshiko Keleher, MD, [facility] Office: [phone]   Jayel Inks Judie PetitM 11/30/2015

## 2015-12-01 NOTE — Progress Notes (Signed)
Date:  December 01, 2015 Chart reviewed for concurrent status and case management needs. Will continue to follow patient for changes and needs:  Starting cl ligs and advancing diet Marcelle Smilinghonda Davis, BSN, RN, [place]   [phone]

## 2015-12-01 NOTE — Progress Notes (Signed)
Patient ID: Guthrie Lemme, male   DOB: 28-May-1966, 50 y.o.   MRN: [id number]  General Surgery [facility]  Subjective: Patient up in chair, passing flatus and had two loose stools.  No nausea or emesis.  Tolerating clear liquid diet.  Objective: Vital signs in last 24 hours: Temp:  [98.1 F (36.7 C)-98.7 F (37.1 C)] 98.1 F (36.7 C) (03/24 0557) Pulse Rate:  [85-97] 85 (03/24 0557) Resp:  [18-19] 19 (03/24 0557) BP: (131-163)/(81-94) 153/86 mmHg (03/24 0630) SpO2:  [96 %-99 %] 96 % (03/24 0557) Last BM Date: 11/29/15  Intake/Output from previous day:   Intake/Output this shift:    Physical Exam: HEENT - sclerae clear, mucous membranes moist Abdomen - softer, BS present; non-tender Ext - no edema, non-tender Neuro - alert & oriented, no focal deficits  Lab Results:   Recent Labs  11/29/15 0538 23-Dec-2015 0530  WBC 7.1 7.8  HGB 13.3 14.1  HCT 40.3 40.0  PLT 176 170   BMET  Recent Labs  11/29/15 0538 Dec 23, 2015 0530  NA 139 138  K 3.6 3.6  CL 109 106  CO2 25 24  GLUCOSE 121* 92  BUN 9 8  CREATININE 1.17 1.03  CALCIUM 8.2* 8.7*   PT/INR No results for input(s): LABPROT, INR in the last 72 hours. Comprehensive Metabolic Panel:    Component Value Date/Time   NA 138 23-Dec-2015 0530   NA 139 11/29/2015 0538   K 3.6 12/23/2015 0530   K 3.6 11/29/2015 0538   CL 106 Dec 23, 2015 0530   CL 109 11/29/2015 0538   CO2 24 12/23/2015 0530   CO2 25 11/29/2015 0538   BUN 8 12/23/15 0530   BUN 9 11/29/2015 0538   CREATININE 1.03 12/23/2015 0530   CREATININE 1.17 11/29/2015 0538   GLUCOSE 92 12/23/2015 0530   GLUCOSE 121* 11/29/2015 0538   CALCIUM 8.7* 2015-12-23 0530   CALCIUM 8.2* 11/29/2015 0538   AST 24 11/27/2015 0752   ALT 28 11/27/2015 0752   ALKPHOS 92 11/27/2015 0752   BILITOT 0.9 11/27/2015 0752   PROT 7.8 11/27/2015 0752   ALBUMIN 4.0 11/27/2015 0752    Studies/Results: Dg Abd 2 Views  12/23/2015  CLINICAL DATA:  Small bowel  obstruction EXAM: ABDOMEN - 2 VIEW COMPARISON:  11/29/2015 FINDINGS: Enteric tube terminates in the mid gastric body. Known malrotation. Dilated loops of small bowel in the right mid abdomen. Contrast within colon in the left mid abdomen. Overall, this appearance is similar to the prior, and remains compatible with partial small bowel obstruction. IMPRESSION: Stable partial small bowel obstruction. Electronically Signed   By: Charline Bills M.D.   On: December 23, 2015 08:50   Dg Abd 2 Views  11/29/2015  CLINICAL DATA:  Follow-up small bowel obstruction EXAM: ABDOMEN - 2 VIEW COMPARISON:  11/28/2015 abdominal radiograph FINDINGS: Enteric tube terminates in the body of the stomach. There are multiple persistently moderately dilated small bowel loops in the right abdomen with air-fluid levels measuring up to the 5.5 cm, previously 4.6 cm, slightly worsened. Retained oral contrast is seen throughout the left-sided colon. No evidence of pneumatosis or pneumoperitoneum. IMPRESSION: Persistent partial distal small bowel obstruction, which radiographically appears slightly worsened. 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 Electronically Signed   By: Delbert Phenix M.D.   On: 11/29/2015 09:56    Assessment & Plans: Small bowel obstruction  Clinically resolving  Advance to full liquid diet today  OOB, ambulate in halls  Continue  to monitor for now  Velora Hecklerodd M. Mirtha Jain, MD, [facility] Office: [phone]   Reygan Heagle Judie PetitM 12/01/2015

## 2015-12-02 MED ORDER — DEXTROSE 50 % IV SOLN
INTRAVENOUS | Status: AC
  Filled 2015-12-02: qty 50

## 2015-12-02 NOTE — Discharge Summary (Signed)
Physician Discharge Summary [facility]  Patient ID: Terry [name] MRN: [id number] DOB/AGE: Jan 26, 1966 50 y.o.  Admit date: 11/27/2015 Discharge date: 12/02/2015  Admission Diagnoses:  Small bowel obstruction  Discharge Diagnoses:  Active Problems:   SBO (small bowel obstruction) (HCC)   Discharged Condition: good  Hospital Course: patient admitted with signs and symptoms of small bowel obstruction, likely secondary to adhesions.  Managed with IV hydration and bowel rest with NG tube placement.  Gradual resolution both clinically and radiographically.  Diet advanced and tolerated without recurrent symptoms.  Prepared for discharge home.  Consults: None  Treatments: IV hydration, NG decompression  Discharge Exam: Blood pressure 121/66, pulse 86, temperature 98.4 F (36.9 C), temperature source Oral, resp. rate 20, height 6' (1.829 m), weight 122.018 kg (269 lb), SpO2 96 %. HEENT - clear Neck - soft Abd - soft, non-distended; BS active  Disposition: Home  Discharge Instructions    Diet - low sodium heart healthy    Complete by:  As directed      Discharge instructions    Complete by:  As directed   [facility] -- DISCHARGE INSTRUCTIONS  REMINDER:   Carry a list of your medications and allergies with you at all times  Call your pharmacy at least 1 week in advance to refill prescriptions  Do not mix any prescribed pain medicine with alcohol  Do not drive any motor vehicles while taking pain medication  Take medications with food unless otherwise directed  Follow-up appointments (date to return to physician): Please call [phone] to confirm your follow up appointment with your surgeon.  Call your Surgeon if you have:  Temperature greater than 101.0  Persistent nausea and vomiting  Severe uncontrolled pain  Redness, tenderness, or signs of infection (pain, swelling, redness, odor or    green/yellow discharge around the  site)  Difficulty breathing, headache or visual disturbances  Hives  Persistent dizziness or light-headedness  Any other questions or concerns you may have after discharge  In an emergency, call 911 or go to an Emergency Department at a nearby hospital.   Diet: Begin with liquids, and if they are tolerated, resume your usual diet.  Avoid spicy, greasy or heavy foods.  If you have nausea or vomiting, go back to liquids.  If you cannot keep liquids down, call your doctor.  Avoid alcohol consumption while on prescription pain medications. Good nutrition promotes healing. Increase fiber and fluids.   ADDITIONAL INSTRUCTIONS: If recurrent symptoms, notify primary care MD or come to emergency room for evaluation.  Velora Hecklerodd M. Antuane Eastridge, MD, [facility] Office: [phone]     Increase activity slowly    Complete by:  As directed      No wound care    Complete by:  As directed             Medication List    TAKE these medications        calcium carbonate 750 MG chewable tablet  Commonly known as:  TUMS EX  Chew 2 tablets by mouth 2 (two) times daily.     fexofenadine 30 MG tablet  Commonly known as:  ALLEGRA  Take 30 mg by mouth daily as needed (seasonal allergies). Reported on 11/27/2015     fluticasone 50 MCG/ACT nasal spray  Commonly known as:  FLONASE  Place 1 spray into both nostrils daily as needed for allergies or rhinitis.     ibuprofen 200 MG tablet  Commonly known  as:  ADVIL,MOTRIN  Take 400 mg by mouth daily as needed for mild pain.     ranitidine 75 MG tablet  Commonly known as:  ZANTAC  Take 75 mg by mouth 2 (two) times daily.           Follow-up Information    Call [facility] [place].   Specialty:  General Surgery   Why:  As needed for recurrent symptoms   Contact information:   [address] [place] [address] [phone]      Velora Heckler, MD, [facility] Office: [phone]   Signed: Velora Heckler 12/02/2015, 8:45 AM

## 2015-12-02 NOTE — Progress Notes (Signed)
Patient discharged.  Leaving with personal belongings.  Denies pain.  Room air.  A&O x4.  Accompanied by wife.  Pt reports understanding of discharge instructions.  No s/s of distress.  No complaints.

## 2016-12-04 ENCOUNTER — Ambulatory Visit (INDEPENDENT_AMBULATORY_CARE_PROVIDER_SITE_OTHER): Payer: Managed Care, Other (non HMO) | Admitting: Podiatry

## 2016-12-04 ENCOUNTER — Encounter: Payer: Self-pay | Admitting: Podiatry

## 2016-12-04 DIAGNOSIS — B070 Plantar wart: Secondary | ICD-10-CM

## 2016-12-05 NOTE — Progress Notes (Signed)
Subjective:     Patient ID: Kathrin RuddyKevin Enderle, male   DOB: March 12, 1966, 51 y.o.   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

## 2016-12-11 ENCOUNTER — Ambulatory Visit (INDEPENDENT_AMBULATORY_CARE_PROVIDER_SITE_OTHER): Payer: Managed Care, Other (non HMO)

## 2016-12-11 ENCOUNTER — Ambulatory Visit (INDEPENDENT_AMBULATORY_CARE_PROVIDER_SITE_OTHER): Payer: Managed Care, Other (non HMO) | Admitting: Family Medicine

## 2016-12-11 DIAGNOSIS — R002 Palpitations: Secondary | ICD-10-CM

## 2016-12-11 DIAGNOSIS — R0789 Other chest pain: Secondary | ICD-10-CM | POA: Diagnosis not present

## 2016-12-11 DIAGNOSIS — R05 Cough: Secondary | ICD-10-CM | POA: Diagnosis not present

## 2016-12-11 DIAGNOSIS — J45909 Unspecified asthma, uncomplicated: Secondary | ICD-10-CM

## 2016-12-11 DIAGNOSIS — R059 Cough, unspecified: Secondary | ICD-10-CM

## 2016-12-11 MED ORDER — BENZONATATE 100 MG PO CAPS: 100.0000 mg | ORAL_CAPSULE | Freq: Three times a day (TID) | ORAL | 0 refills | Status: DC | PRN

## 2016-12-11 MED ORDER — HYDROCODONE-HOMATROPINE 5-1.5 MG/5ML PO SYRP: 5.0000 mL | ORAL_SOLUTION | ORAL | 0 refills | Status: DC | PRN

## 2016-12-11 NOTE — Progress Notes (Addendum)
Patient ID: Terry Delgado, male    DOB: [birth year]  Age: 51 y.o. MRN: [id number]  Chief Complaint  Patient presents with  . Cough    X 1 day  . Palpitations    X 1 day    Subjective:   51 year old Afro-American male who works at [employer]. He had a 45 second coughing paroxysmal episode a couple of hours ago. It was a dry nonproductive cough. It caused tightness in his chest. Afterwards he was walking around a little bit and could feel his heart racing. A friend checked his pulse and said he was having skipped beats. Symptoms seem to come and go. He came on in here. He still has little episodes of coughing but no more prolonged spasms like he did. Still gets feeling a little tight when he coughs. He does not feel that way at all with rest. He is a nonsmoker. Not on any major prescription meds. He has a history of GERD and heartburn and occasionally takes something for that, took a Prilosec this morning (may have been a Zantac, he can't recall for certain) he started taking Allegra D3 days ago for a springtime allergies. He goes to the gym about once a week. A few days ago he went to the gym and was on the neck says bike for over 30 minutes keeping his heart rates above 120 without any adverse effects.  Current allergies, medications, problem list, past/family and social histories reviewed.  Objective:  BP 115/82 (BP Location: Right Arm, Patient Position: Sitting, Cuff Size: Large)   Pulse (!) 102   Temp 98.7 F (37.1 C) (Oral)   Ht 6' (1.829 m)   Wt 268 lb 12.8 oz (121.9 kg)   SpO2 97%   BMI 36.46 kg/m   Vitals are stable except for the mildly elevated pulse. On auscultation his pulse was not that high at the time. He did not note any ectopics. His chest is clear. Heart regular without murmur. Throat clear. Neck supple without significant nodes.  Assessment & Plan:   Assessment: 1. Cough   2. Chest tightness   3. Palpitations   4. Bronchitis, allergic, unspecified asthma severity,  uncomplicated       Plan: We'll check an EKG for the palpitations. The chest tightness seems to be chest wall from the coughing. Will check a chest x-ray. He does not feel like he could've aspirated anything because he wasn't eating.  EKG appears normal, slightly tachycardic at 97. Rhythm strip reveals scattered PACs.  The Hycodan cough syrup prescription and didn't print the first time for some reason so we reprinted it.  Orders Placed This Encounter  Procedures  . DG Chest 2 View    Standing Status:   Future    Number of Occurrences:   1    Standing Expiration Date:   12/11/2017    Order Specific Question:   Reason for Exam (SYMPTOM  OR DIAGNOSIS REQUIRED)    Answer:   cough, palpitations    Order Specific Question:   Preferred imaging location?    Answer:   External  . EKG 12-Lead    No orders of the defined types were placed in this encounter.        Patient Instructions   Drink plenty of fluids  Take Prilosec 1 daily for your reflux  Change the Allegra-D to taking just plain Allegra one daily  Take the benzonatate cough pills one or 2 pills 3 times daily for daytime cough. This is  nonsedating so you can take it when you work.  If necessary take the Hycodan cough syrup 1 teaspoon every 4-6 hours for worse cough. This is sedating, so it is useful at nighttime but sometimes doesn't work well if you're trying to work.  Wait a few days before resuming your activity at the gym  Avoid excessive caffeine which can make you have more the palpitations (PACs). Especially avoid energy drinks.  If you have any abruptly worse go to the emergency room if necessary. If he keeps noticing palpitations we will refer you to a cardiologist to do a heart monitor test on you to see if it shows anything else.  Return as needed    IF you received an x-ray today, you will receive an invoice from [facility]. Please contact [facility] at [phone] with questions  or concerns regarding your invoice.   IF you received labwork today, you will receive an invoice from [facility]. Please contact [facility] at [phone] with questions or concerns regarding your invoice.   Our billing staff will not be able to assist you with questions regarding bills from these companies.  You will be contacted with the lab results as soon as they are available. The fastest way to get your results is to activate your My Chart account. Instructions are located on the last page of this paperwork. If you have not heard from [place] regarding the results in 2 weeks, please contact this office.         No Follow-up on file.   HOPPER,DAVID, MD 12/11/2016

## 2016-12-11 NOTE — Addendum Note (Signed)
Addended by: Aya Geisel H on: 12/11/2016 04:11 PM   Modules accepted: Orders

## 2016-12-11 NOTE — Patient Instructions (Addendum)
Drink plenty of fluids  Take Prilosec 1 daily for your reflux  Change the Allegra-D to taking just plain Allegra one daily  Take the benzonatate cough pills one or 2 pills 3 times daily for daytime cough. This is nonsedating so you can take it when you work.  If necessary take the Hycodan cough syrup 1 teaspoon every 4-6 hours for worse cough. This is sedating, so it is useful at nighttime but sometimes doesn't work well if you're trying to work.  Wait a few days before resuming your activity at the gym  Avoid excessive caffeine which can make you have more the palpitations (PACs). Especially avoid energy drinks.  If you have any abruptly worse go to the emergency room if necessary. If he keeps noticing palpitations we will refer you to a cardiologist to do a heart monitor test on you to see if it shows anything else.  Return as needed    IF you received an x-ray today, you will receive an invoice from [facility]. Please contact [facility] at [phone] with questions or concerns regarding your invoice.   IF you received labwork today, you will receive an invoice from [facility]. Please contact [facility] at [phone] with questions or concerns regarding your invoice.   Our billing staff will not be able to assist you with questions regarding bills from these companies.  You will be contacted with the lab results as soon as they are available. The fastest way to get your results is to activate your My Chart account. Instructions are located on the last page of this paperwork. If you have not heard from [place] regarding the results in 2 weeks, please contact this office.

## 2018-01-14 DIAGNOSIS — R002 Palpitations: Secondary | ICD-10-CM | POA: Diagnosis not present

## 2018-02-16 DIAGNOSIS — I4892 Unspecified atrial flutter: Secondary | ICD-10-CM | POA: Diagnosis not present

## 2018-02-16 DIAGNOSIS — I471 Supraventricular tachycardia: Secondary | ICD-10-CM | POA: Diagnosis not present

## 2018-03-05 DIAGNOSIS — I4892 Unspecified atrial flutter: Secondary | ICD-10-CM | POA: Diagnosis not present

## 2018-04-13 ENCOUNTER — Emergency Department (HOSPITAL_COMMUNITY): Payer: BLUE CROSS/BLUE SHIELD

## 2018-04-13 ENCOUNTER — Other Ambulatory Visit: Payer: Self-pay

## 2018-04-13 ENCOUNTER — Emergency Department (HOSPITAL_COMMUNITY)
Admission: EM | Admit: 2018-04-13 | Discharge: 2018-04-13 | Disposition: A | Discharge status: Home / Self Care | Payer: BLUE CROSS/BLUE SHIELD | Attending: Emergency Medicine | Admitting: Emergency Medicine

## 2018-04-13 ENCOUNTER — Encounter (HOSPITAL_COMMUNITY): Payer: Self-pay | Admitting: Emergency Medicine

## 2018-04-13 ENCOUNTER — Encounter: Payer: Self-pay | Admitting: Family Medicine

## 2018-04-13 ENCOUNTER — Ambulatory Visit (INDEPENDENT_AMBULATORY_CARE_PROVIDER_SITE_OTHER): Payer: BLUE CROSS/BLUE SHIELD | Admitting: Family Medicine

## 2018-04-13 VITALS — BP 100/68 | HR 79 | Temp 98.9°F

## 2018-04-13 DIAGNOSIS — R079 Chest pain, unspecified: Secondary | ICD-10-CM

## 2018-04-13 DIAGNOSIS — Z5321 Procedure and treatment not carried out due to patient leaving prior to being seen by health care provider: Secondary | ICD-10-CM | POA: Diagnosis not present

## 2018-04-13 DIAGNOSIS — R002 Palpitations: Secondary | ICD-10-CM | POA: Diagnosis not present

## 2018-04-13 DIAGNOSIS — I4891 Unspecified atrial fibrillation: Secondary | ICD-10-CM | POA: Diagnosis not present

## 2018-04-13 DIAGNOSIS — R61 Generalized hyperhidrosis: Secondary | ICD-10-CM | POA: Diagnosis not present

## 2018-04-13 DIAGNOSIS — I498 Other specified cardiac arrhythmias: Secondary | ICD-10-CM

## 2018-04-13 DIAGNOSIS — R0789 Other chest pain: Secondary | ICD-10-CM | POA: Diagnosis not present

## 2018-04-13 HISTORY — DX: Unspecified atrial fibrillation: I48.91

## 2018-04-13 HISTORY — DX: Essential (primary) hypertension: I10

## 2018-04-13 LAB — CBC
HCT: 44.8 % (ref 39.0–52.0)
HEMOGLOBIN: 14.3 g/dL (ref 13.0–17.0)
MCH: 27.6 pg (ref 26.0–34.0)
MCHC: 31.9 g/dL (ref 30.0–36.0)
MCV: 86.5 fL (ref 78.0–100.0)
PLATELETS: 196 10*3/uL (ref 150–400)
RBC: 5.18 MIL/uL (ref 4.22–5.81)
RDW: 13.2 % (ref 11.5–15.5)
WBC: 8.7 10*3/uL (ref 4.0–10.5)

## 2018-04-13 LAB — BASIC METABOLIC PANEL
ANION GAP: 8 (ref 5–15)
BUN: 14 mg/dL (ref 6–20)
CHLORIDE: 110 mmol/L (ref 98–111)
CO2: 24 mmol/L (ref 22–32)
CREATININE: 1.1 mg/dL (ref 0.61–1.24)
Calcium: 8.9 mg/dL (ref 8.9–10.3)
GFR calc non Af Amer: >60 mL/min (ref >60)
Glucose, Bld: 87 mg/dL (ref 70–99)
POTASSIUM: 4.3 mmol/L (ref 3.5–5.1)
SODIUM: 142 mmol/L (ref 135–145)

## 2018-04-13 LAB — I-STAT TROPONIN, ED: Troponin i, poc: 0 ng/mL (ref 0.00–0.08)

## 2018-04-13 NOTE — Progress Notes (Signed)
8/5/20194:41 PM  Terry Delgado 1966/02/02, 52 y.o. male [id number]  Chief Complaint  Patient presents with  . dry mouth    with dizziness  . heart flutters    on and off since 1pm  today   . Excessive Sweating    since 1 pm as well    HPI:   Patient is a 52 y.o. male  who presents today for palpitations, chest pain, sweating and dizziness  H/o palpitations and GERD Seen by cards June 2019 - holter a flutter, rx metoprolol XL 25mg , patient did not start yet  Patient reports feeling well this morning Was having lunch with a co-worker when suddenly felt chest tightness, palpitations, got lightheaded and sweaty This had happened before and would normally go away in within 30 minutes, however this episode has persisted for about 3 hours.  Chest tightness resolved but still feeling lightheaded and hot Denies any SOB, nausea or vomiting He does reports some heartburn earlier  Fall Risk  12/11/2016  Falls in the past year? No     Depression screen PHQ 2/9 12/11/2016  Decreased Interest 0  Down, Depressed, Hopeless 0  PHQ - 2 Score 0    Allergies  Allergen Reactions  . Penicillins Rash    Has patient had a PCN reaction causing immediate rash, facial/tongue/throat swelling, SOB or lightheadedness with hypotension: unknown Has patient had a PCN reaction causing severe rash involving mucus membranes or skin necrosis: No Has patient had a PCN reaction that required hospitalization No Has patient had a PCN reaction occurring within the last 10 years: No If all of the above answers are "NO", then may proceed with Cephalosporin use.    Prior to Admission medications   Medication Sig Start Date End Date Taking? Authorizing Provider  benzonatate (TESSALON) 100 MG capsule Take 1-2 capsules (100-200 mg total) by mouth 3 (three) times daily as needed. 12/11/16   Peyton Najjar, MD  fexofenadine (ALLEGRA) 30 MG tablet Take 30 mg by mouth daily as needed (seasonal allergies). Reported on  11/27/2015    [provider]  fluticasone (FLONASE) 50 MCG/ACT nasal spray Place 1 spray into both nostrils daily as needed for allergies or rhinitis.    [provider]  HYDROcodone-homatropine (HYCODAN) 5-1.5 MG/5ML syrup Take 5 mLs by mouth every 4 (four) hours as needed. 12/11/16   Peyton Najjar, MD  ibuprofen (ADVIL,MOTRIN) 200 MG tablet Take 400 mg by mouth daily as needed for mild pain.    [provider]  ranitidine (ZANTAC) 75 MG tablet Take 75 mg by mouth 2 (two) times daily.    [provider]    Past Medical History:  Diagnosis Date  . Allergy   . GERD (gastroesophageal reflux disease)     Past Surgical History:  Procedure Laterality Date  . STOMACH SURGERY     infant    Social History   Tobacco Use  . Smoking status: Never Smoker  . Smokeless tobacco: Never Used  Substance Use Topics  . Alcohol use: Yes    Alcohol/week: 0.6 oz    Types: 1 Cans of beer per week    Comment: occ    Family History  Problem Relation Age of Onset  . Cancer Mother   . Hypertension Father   . Diabetes Father   . COPD Brother   . Depression Brother     ROS Per hpi  OBJECTIVE:  Blood pressure 100/68, pulse 79, temperature 98.9 F (37.2 C), temperature source Oral,  SpO2 97 %. There is no height or weight on file to calculate BMI.   orthostatic BP Lying: 100/68, 115 Standing 1 min: 100/64, 145 Standing 3 min: 110/60, 97  My interpretation of EKG:  Afib, HR 120, no st changes  BP at cards 120/80s  Physical Exam  Constitutional: He is oriented to person, place, and time. He appears well-developed and well-nourished. No distress.  HENT:  Head: Normocephalic and atraumatic.  Mouth/Throat: Oropharynx is clear and moist.  Eyes: Pupils are equal, round, and reactive to light. EOM are normal.  Neck: Neck supple.  Cardiovascular: Normal rate, S1 normal, S2 normal and normal pulses. An irregularly irregular rhythm present. Exam reveals no  gallop and no friction rub.  No murmur heard. Pulmonary/Chest: Effort normal and breath sounds normal. He has no wheezes. He has no rales.  Musculoskeletal: He exhibits no edema.  Neurological: He is alert and oriented to person, place, and time.  Skin: Skin is warm. He is diaphoretic.  Nursing note and vitals reviewed.    ASSESSMENT and PLAN  1. Atrial fibrillation, unspecified type (HCC) 2. Palpitations - EKG 12-Lead 3. Diaphoresis 4. Chest pain, unspecified type Patient presenting with afib (chronic? Paroxysmal? ) with low-normal BP and diaphoresis in setting of chest pain ~ 3 hours. ACS r/o needed. Sending to ER via PV as hemodynamically stable. Advised following up with cards after ER visit.  Return if symptoms worsen or fail to improve.    Myles LippsIrma M Santiago, MD [facility] [address] [place], [place] [address] Ph.  [phone] Fax [phone]

## 2018-04-13 NOTE — ED Triage Notes (Signed)
Pt sent by PCP for evaluation of chest pain today. Hx afib, but pt not taking medications. States he has intermittent episodes of chest pain, today's episode lasting longer than usual. Denies pain at this time.

## 2018-04-13 NOTE — ED Notes (Signed)
Pt came to front desk to ask where he was at on the list. Pt informed that he was next to go back. Pt stated that he didn't feel like waiting any longer. Pt encouraged to stay and stated "I will stay 15 more minutes"

## 2018-04-13 NOTE — ED Notes (Signed)
Pt came to front desk and stated that he was leaving. Pt observed to be ambulatory out the exit NAD. Pt encouraged to stay but did not.

## 2018-04-13 NOTE — ED Provider Notes (Signed)
Patient placed in Quick Look pathway, seen and evaluated   Chief Complaint: chest pain  HPI:   Terry Delgado is a 52 y.o. male who presents to the ED with chest pain. The pain started today about 12:30 pm. The pain persisted and patient went to a clinic and was evaluated and told to come to the ED. Patient has a hx of irregular heart beat and has been evaluated by a cardiologist about a month ago and was given Rx for medication to start. Patient has not started the medication because he saw the side effects it could have.   ROS: Pulmonary: chest pain, short of breath  Physical Exam:  BP (!) 152/86 (BP Location: Right Arm)   Pulse 100   Temp 98.7 F (37.1 C) (Oral)   Resp 18   SpO2 95%    Gen: No distress  Neuro: Awake and Alert  Skin: Warm and dry  Heart: regular rate Patient reports since arrival to the ED his shortness of breath and dizziness have stopped.   Initiation of care has begun. The patient has been counseled on the process, plan, and necessity for staying for the completion/evaluation, and the remainder of the medical screening examination    [name], Hope M, NP 04/13/18 1714    Derwood KaplanNanavati, Ankit, MD 04/24/18 1050

## 2018-07-08 DIAGNOSIS — I4892 Unspecified atrial flutter: Secondary | ICD-10-CM | POA: Diagnosis not present

## 2018-07-31 DIAGNOSIS — R002 Palpitations: Secondary | ICD-10-CM | POA: Diagnosis not present

## 2018-07-31 DIAGNOSIS — I4892 Unspecified atrial flutter: Secondary | ICD-10-CM | POA: Diagnosis not present

## 2018-11-11 DIAGNOSIS — J101 Influenza due to other identified influenza virus with other respiratory manifestations: Secondary | ICD-10-CM | POA: Diagnosis not present

## 2019-01-15 DIAGNOSIS — I483 Typical atrial flutter: Secondary | ICD-10-CM | POA: Insufficient documentation

## 2019-02-05 DIAGNOSIS — I483 Typical atrial flutter: Secondary | ICD-10-CM | POA: Diagnosis not present

## 2019-02-08 DIAGNOSIS — I483 Typical atrial flutter: Secondary | ICD-10-CM | POA: Diagnosis not present

## 2019-03-18 DIAGNOSIS — I483 Typical atrial flutter: Secondary | ICD-10-CM | POA: Diagnosis not present

## 2019-03-19 DIAGNOSIS — I483 Typical atrial flutter: Secondary | ICD-10-CM | POA: Diagnosis not present

## 2019-03-19 DIAGNOSIS — I480 Paroxysmal atrial fibrillation: Secondary | ICD-10-CM | POA: Diagnosis not present

## 2019-03-30 DIAGNOSIS — I480 Paroxysmal atrial fibrillation: Secondary | ICD-10-CM | POA: Diagnosis not present

## 2019-03-30 DIAGNOSIS — K219 Gastro-esophageal reflux disease without esophagitis: Secondary | ICD-10-CM | POA: Diagnosis not present

## 2019-03-30 DIAGNOSIS — I483 Typical atrial flutter: Secondary | ICD-10-CM | POA: Diagnosis not present

## 2019-03-30 DIAGNOSIS — E669 Obesity, unspecified: Secondary | ICD-10-CM | POA: Diagnosis not present

## 2019-03-30 DIAGNOSIS — Z6836 Body mass index (BMI) 36.0-36.9, adult: Secondary | ICD-10-CM | POA: Diagnosis not present

## 2019-04-02 DIAGNOSIS — E669 Obesity, unspecified: Secondary | ICD-10-CM | POA: Diagnosis not present

## 2019-04-02 DIAGNOSIS — Z6836 Body mass index (BMI) 36.0-36.9, adult: Secondary | ICD-10-CM | POA: Diagnosis not present

## 2019-04-02 DIAGNOSIS — K219 Gastro-esophageal reflux disease without esophagitis: Secondary | ICD-10-CM | POA: Diagnosis not present

## 2019-04-02 DIAGNOSIS — I480 Paroxysmal atrial fibrillation: Secondary | ICD-10-CM | POA: Diagnosis not present

## 2019-04-02 DIAGNOSIS — I483 Typical atrial flutter: Secondary | ICD-10-CM | POA: Diagnosis not present

## 2019-06-30 ENCOUNTER — Other Ambulatory Visit: Payer: Self-pay

## 2019-06-30 DIAGNOSIS — Z20822 Contact with and (suspected) exposure to covid-19: Secondary | ICD-10-CM

## 2019-07-01 LAB — NOVEL CORONAVIRUS, NAA: SARS-CoV-2, NAA: DETECTED — AB

## 2019-07-08 ENCOUNTER — Inpatient Hospital Stay (HOSPITAL_COMMUNITY)
Admission: EM | Admit: 2019-07-08 | Discharge: 2019-07-16 | DRG: 177 | Disposition: A | Discharge status: Home / Self Care | Payer: BC Managed Care – PPO | Attending: Internal Medicine | Admitting: Internal Medicine

## 2019-07-08 ENCOUNTER — Emergency Department (HOSPITAL_COMMUNITY): Payer: BC Managed Care – PPO

## 2019-07-08 ENCOUNTER — Encounter (HOSPITAL_COMMUNITY): Payer: Self-pay | Admitting: Emergency Medicine

## 2019-07-08 ENCOUNTER — Other Ambulatory Visit: Payer: Self-pay

## 2019-07-08 DIAGNOSIS — R7989 Other specified abnormal findings of blood chemistry: Secondary | ICD-10-CM

## 2019-07-08 DIAGNOSIS — R1084 Generalized abdominal pain: Secondary | ICD-10-CM

## 2019-07-08 DIAGNOSIS — R6520 Severe sepsis without septic shock: Secondary | ICD-10-CM | POA: Diagnosis not present

## 2019-07-08 DIAGNOSIS — R778 Other specified abnormalities of plasma proteins: Secondary | ICD-10-CM

## 2019-07-08 DIAGNOSIS — R112 Nausea with vomiting, unspecified: Secondary | ICD-10-CM | POA: Diagnosis not present

## 2019-07-08 DIAGNOSIS — A419 Sepsis, unspecified organism: Secondary | ICD-10-CM

## 2019-07-08 DIAGNOSIS — R0602 Shortness of breath: Secondary | ICD-10-CM | POA: Diagnosis not present

## 2019-07-08 DIAGNOSIS — K5900 Constipation, unspecified: Secondary | ICD-10-CM | POA: Diagnosis not present

## 2019-07-08 DIAGNOSIS — Z880 Allergy status to penicillin: Secondary | ICD-10-CM | POA: Diagnosis not present

## 2019-07-08 DIAGNOSIS — Z79899 Other long term (current) drug therapy: Secondary | ICD-10-CM | POA: Diagnosis not present

## 2019-07-08 DIAGNOSIS — I10 Essential (primary) hypertension: Secondary | ICD-10-CM | POA: Diagnosis not present

## 2019-07-08 DIAGNOSIS — Q433 Congenital malformations of intestinal fixation: Secondary | ICD-10-CM | POA: Diagnosis not present

## 2019-07-08 DIAGNOSIS — J1289 Other viral pneumonia: Secondary | ICD-10-CM | POA: Diagnosis not present

## 2019-07-08 DIAGNOSIS — Z7982 Long term (current) use of aspirin: Secondary | ICD-10-CM

## 2019-07-08 DIAGNOSIS — J1282 Pneumonia due to coronavirus disease 2019: Secondary | ICD-10-CM

## 2019-07-08 DIAGNOSIS — Z6835 Body mass index (BMI) 35.0-35.9, adult: Secondary | ICD-10-CM | POA: Diagnosis not present

## 2019-07-08 DIAGNOSIS — R197 Diarrhea, unspecified: Secondary | ICD-10-CM | POA: Diagnosis not present

## 2019-07-08 DIAGNOSIS — I4892 Unspecified atrial flutter: Secondary | ICD-10-CM | POA: Diagnosis present

## 2019-07-08 DIAGNOSIS — Z8249 Family history of ischemic heart disease and other diseases of the circulatory system: Secondary | ICD-10-CM | POA: Diagnosis not present

## 2019-07-08 DIAGNOSIS — K219 Gastro-esophageal reflux disease without esophagitis: Secondary | ICD-10-CM | POA: Diagnosis present

## 2019-07-08 DIAGNOSIS — E669 Obesity, unspecified: Secondary | ICD-10-CM

## 2019-07-08 DIAGNOSIS — I340 Nonrheumatic mitral (valve) insufficiency: Secondary | ICD-10-CM | POA: Diagnosis not present

## 2019-07-08 DIAGNOSIS — Z825 Family history of asthma and other chronic lower respiratory diseases: Secondary | ICD-10-CM | POA: Diagnosis not present

## 2019-07-08 DIAGNOSIS — R7303 Prediabetes: Secondary | ICD-10-CM | POA: Diagnosis present

## 2019-07-08 DIAGNOSIS — I4891 Unspecified atrial fibrillation: Secondary | ICD-10-CM | POA: Diagnosis not present

## 2019-07-08 DIAGNOSIS — I248 Other forms of acute ischemic heart disease: Secondary | ICD-10-CM | POA: Diagnosis not present

## 2019-07-08 DIAGNOSIS — Z833 Family history of diabetes mellitus: Secondary | ICD-10-CM | POA: Diagnosis not present

## 2019-07-08 DIAGNOSIS — U071 COVID-19: Principal | ICD-10-CM | POA: Diagnosis present

## 2019-07-08 DIAGNOSIS — I480 Paroxysmal atrial fibrillation: Secondary | ICD-10-CM | POA: Diagnosis present

## 2019-07-08 DIAGNOSIS — I361 Nonrheumatic tricuspid (valve) insufficiency: Secondary | ICD-10-CM | POA: Diagnosis not present

## 2019-07-08 DIAGNOSIS — J9601 Acute respiratory failure with hypoxia: Secondary | ICD-10-CM | POA: Diagnosis not present

## 2019-07-08 LAB — COMPREHENSIVE METABOLIC PANEL
ALT: 96 U/L — ABNORMAL HIGH (ref 0–44)
AST: 76 U/L — ABNORMAL HIGH (ref 15–41)
Albumin: 3.3 g/dL — ABNORMAL LOW (ref 3.5–5.0)
Alkaline Phosphatase: 97 U/L (ref 38–126)
Anion gap: 12 (ref 5–15)
BUN: 13 mg/dL (ref 6–20)
CO2: 20 mmol/L — ABNORMAL LOW (ref 22–32)
Calcium: 8.5 mg/dL — ABNORMAL LOW (ref 8.9–10.3)
Chloride: 101 mmol/L (ref 98–111)
Creatinine, Ser: 1.09 mg/dL (ref 0.61–1.24)
GFR calc Af Amer: >60 mL/min (ref >60)
GFR calc non Af Amer: >60 mL/min (ref >60)
Glucose, Bld: 143 mg/dL — ABNORMAL HIGH (ref 70–99)
Potassium: 3.6 mmol/L (ref 3.5–5.1)
Sodium: 133 mmol/L — ABNORMAL LOW (ref 135–145)
Total Bilirubin: 1.1 mg/dL (ref 0.3–1.2)
Total Protein: 7.6 g/dL (ref 6.5–8.1)

## 2019-07-08 LAB — CBC WITH DIFFERENTIAL/PLATELET
Abs Immature Granulocytes: 0.04 10*3/uL (ref 0.00–0.07)
Basophils Absolute: 0 10*3/uL (ref 0.0–0.1)
Basophils Relative: 0 %
Eosinophils Absolute: 0 10*3/uL (ref 0.0–0.5)
Eosinophils Relative: 0 %
HCT: 45 % (ref 39.0–52.0)
Hemoglobin: 14.8 g/dL (ref 13.0–17.0)
Immature Granulocytes: 0 %
Lymphocytes Relative: 5 %
Lymphs Abs: 0.6 10*3/uL — ABNORMAL LOW (ref 0.7–4.0)
MCH: 27.6 pg (ref 26.0–34.0)
MCHC: 32.9 g/dL (ref 30.0–36.0)
MCV: 83.8 fL (ref 80.0–100.0)
Monocytes Absolute: 0.3 10*3/uL (ref 0.1–1.0)
Monocytes Relative: 3 %
Neutro Abs: 10.5 10*3/uL — ABNORMAL HIGH (ref 1.7–7.7)
Neutrophils Relative %: 92 %
Platelets: 130 10*3/uL — ABNORMAL LOW (ref 150–400)
RBC: 5.37 MIL/uL (ref 4.22–5.81)
RDW: 12.8 % (ref 11.5–15.5)
WBC: 11.5 10*3/uL — ABNORMAL HIGH (ref 4.0–10.5)
nRBC: 0 % (ref 0.0–0.2)

## 2019-07-08 LAB — LACTATE DEHYDROGENASE: LDH: 323 U/L — ABNORMAL HIGH (ref 98–192)

## 2019-07-08 LAB — PROCALCITONIN: Procalcitonin: 0.37 ng/mL

## 2019-07-08 LAB — FERRITIN: Ferritin: 1961 ng/mL — ABNORMAL HIGH (ref 24–336)

## 2019-07-08 LAB — LACTIC ACID, PLASMA: Lactic Acid, Venous: 1.3 mmol/L (ref 0.5–1.9)

## 2019-07-08 LAB — FIBRINOGEN: Fibrinogen: >800 mg/dL — ABNORMAL HIGH (ref 210–475)

## 2019-07-08 LAB — C-REACTIVE PROTEIN: CRP: 22.4 mg/dL — ABNORMAL HIGH (ref <1.0)

## 2019-07-08 LAB — LIPASE, BLOOD: Lipase: 27 U/L (ref 11–51)

## 2019-07-08 LAB — TRIGLYCERIDES: Triglycerides: 56 mg/dL (ref <150)

## 2019-07-08 LAB — D-DIMER, QUANTITATIVE (NOT AT ARMC): D-Dimer, Quant: 0.65 ug/mL-FEU — ABNORMAL HIGH (ref 0.00–0.50)

## 2019-07-08 MED ORDER — IOHEXOL 350 MG/ML SOLN
100.0000 mL | Freq: Once | INTRAVENOUS | Status: AC | PRN
  Administered 2019-07-08: 100 mL via INTRAVENOUS

## 2019-07-08 MED ORDER — ENOXAPARIN SODIUM 60 MG/0.6ML ~~LOC~~ SOLN: 60.0000 mg | SUBCUTANEOUS | Status: DC

## 2019-07-08 MED ORDER — SODIUM CHLORIDE 0.9 % IV BOLUS
1000.0000 mL | Freq: Once | INTRAVENOUS | Status: AC
  Administered 2019-07-08: 1000 mL via INTRAVENOUS

## 2019-07-08 MED ORDER — PANTOPRAZOLE SODIUM 40 MG PO TBEC
40.0000 mg | DELAYED_RELEASE_TABLET | Freq: Every day | ORAL | Status: DC
  Administered 2019-07-09 – 2019-07-16 (×8): 40 mg via ORAL
  Filled 2019-07-08 (×8): qty 1

## 2019-07-08 MED ORDER — ASPIRIN EC 81 MG PO TBEC
81.0000 mg | DELAYED_RELEASE_TABLET | Freq: Every day | ORAL | Status: DC
  Administered 2019-07-09 – 2019-07-16 (×8): 81 mg via ORAL
  Filled 2019-07-08 (×8): qty 1

## 2019-07-08 MED ORDER — DEXAMETHASONE 6 MG PO TABS
6.0000 mg | ORAL_TABLET | ORAL | Status: DC
  Administered 2019-07-08: 18:00:00 6 mg via ORAL
  Filled 2019-07-08: qty 1

## 2019-07-08 MED ORDER — MORPHINE SULFATE (PF) 4 MG/ML IV SOLN
4.0000 mg | Freq: Once | INTRAVENOUS | Status: AC
  Administered 2019-07-08: 4 mg via INTRAVENOUS
  Filled 2019-07-08: qty 1

## 2019-07-08 MED ORDER — SODIUM CHLORIDE 0.9 % IV SOLN
200.0000 mg | Freq: Once | INTRAVENOUS | Status: AC
  Administered 2019-07-08: 200 mg via INTRAVENOUS
  Filled 2019-07-08: qty 40

## 2019-07-08 MED ORDER — ACETAMINOPHEN 500 MG PO TABS
1000.0000 mg | ORAL_TABLET | Freq: Once | ORAL | Status: AC
  Administered 2019-07-08: 11:00:00 1000 mg via ORAL
  Filled 2019-07-08: qty 2

## 2019-07-08 MED ORDER — SODIUM CHLORIDE (PF) 0.9 % IJ SOLN
INTRAMUSCULAR | Status: AC
  Filled 2019-07-08: qty 50

## 2019-07-08 MED ORDER — SODIUM CHLORIDE 0.9 % IV SOLN
INTRAVENOUS | Status: DC
  Administered 2019-07-09: 05:00:00 via INTRAVENOUS

## 2019-07-08 MED ORDER — SODIUM CHLORIDE 0.9 % IV SOLN
100.0000 mg | INTRAVENOUS | Status: AC
  Administered 2019-07-09 – 2019-07-12 (×4): 100 mg via INTRAVENOUS
  Filled 2019-07-08 (×4): qty 20

## 2019-07-08 NOTE — ED Notes (Signed)
Took patient off of NRB to assess SpO2. Dropped to 88, then put patient on [place] 10L and SpO2 only went up to 90. Patient is back on 15L NRB at this time. SpO2 94.

## 2019-07-08 NOTE — ED Notes (Signed)
Pt complains of severe stomach pain that moved to lower back, and at this time complains that the pain is moving to his neck.

## 2019-07-08 NOTE — Consult Note (Signed)
[facility] Surgery Consult Note  Terry Delgado [birth year]  [id number].    Requesting MD: Ralph Leyden [facility] Chief Complaint:  Worsening hypoxia Reason for Consult: worsening abdominal pain HPI: Patient is a 52 year old male with a past medical history of atrial fibrillation not on anticoagulation who had a prior ablation 04/02/2019.  He was found to be positive for Covid on 06/30/2019.  He has had increased shortness of breath over the last 5 days.  It is worse with ambulation.  He states his atrial flutter and atrial fib have been worse since his Covid diagnosis with multiple episodes.  Is also had multiple episodes of diarrhea which is without melena or hematochezia.  Decreased appetite, low-grade fevers at home.  He has some abdominal pain and CT scan was reviewed.  This shows no demonstratable PE or thoracic aneurysm, multi focal airspace disease consistent with extensive bilateral multifocal pneumonia.  The CT shows a degree of malrotation but no bowel dilatation or obstruction no abscess in the abdominal pelvis no internal hernia.  His initial exam was unremarkable but he has become much more rigid and tender since the original exam and we are asked to see.  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    ROS: Review of Systems  Constitutional: Positive for fever.  HENT: Negative.   Eyes: Negative.   Respiratory: Positive for cough and shortness of breath.   Cardiovascular: Positive for chest pain and palpitations.  Gastrointestinal: Positive for abdominal pain, diarrhea and nausea. Negative for constipation and vomiting.  Genitourinary: Negative.   Musculoskeletal: Positive for myalgias.  Skin: Negative.   Neurological: Negative.   Endo/Heme/Allergies: Negative.   Psychiatric/Behavioral: Negative.     Family History  Problem Relation Age of Onset  . Cancer Mother   . Hypertension Father   . Diabetes Father   . COPD Brother   . Depression  Brother     Past Medical History:  Diagnosis Date  . A-fib (HCC)   . Allergy   . GERD (gastroesophageal reflux disease)   . Hypertension     Past Surgical History:  Procedure Laterality Date  . STOMACH SURGERY     infant    Social History:  reports that he has never smoked. He has never used smokeless tobacco. He reports current alcohol use of about 1.0 standard drinks of alcohol per week. He reports that he does not use drugs.  Allergies:  Allergies  Allergen Reactions  . Penicillins Rash    Has patient had a PCN reaction causing immediate rash, facial/tongue/throat swelling, SOB or lightheadedness with hypotension: unknown Has patient had a PCN reaction causing severe rash involving mucus membranes or skin necrosis: No Has patient had a PCN reaction that required hospitalization No Has patient had a PCN reaction occurring within the last 10 years: No If all of the above answers are "NO", then may proceed with Cephalosporin use.    Prior to Admission medications   Medication Sig Start Date End Date Taking? Authorizing Provider  aspirin EC 81 MG tablet Take 81 mg by mouth daily.   Yes [provider]  esomeprazole (NEXIUM) 20 MG capsule Take 20 mg by mouth daily.   Yes [provider]  fexofenadine (ALLEGRA) 30 MG tablet Take 30 mg by mouth daily as needed (seasonal allergies). Reported on 11/27/2015   Yes [provider]  fluticasone (FLONASE) 50 MCG/ACT nasal spray Place 1 spray into both nostrils daily as needed for allergies or rhinitis.  Yes [provider]  ibuprofen (ADVIL,MOTRIN) 200 MG tablet Take 400 mg by mouth daily as needed for mild pain.   Yes [provider]     Blood pressure (!) 181/111, pulse (!) 138, temperature 99.8 F (37.7 C), temperature source Oral, resp. rate (!) 52, SpO2 (!) 83 %. Physical Exam: Physical Exam Constitutional:      Appearance: He is well-developed. He is obese.     Comments:  53 year old male 100% rebreather mask.  Heart rates in the 120s he appears to be in sinus rhythm right now.  Respiratory rate in the 30s.  Pulmonary:     Effort: Tachypnea and respiratory distress (He is working hard to maintain sats.) present.     Comments: His respiratory rate in the 30s.  He is satting in the low 90s on 100% rebreather.  He is coughing but did not bring up anything. Chest:     Chest wall: No deformity or crepitus.  Abdominal:     Palpations: Abdomen is soft.     Comments: Decreased bowel sounds.  He is not tender to palpation no rebound.  Musculoskeletal:     Right lower leg: He exhibits no tenderness. No edema.     Left lower leg: He exhibits no tenderness. No edema.  Skin:    General: Skin is warm and dry.     Capillary Refill: Capillary refill takes 2 to 3 seconds.  Neurological:     General: No focal deficit present.     Mental Status: He is alert and oriented to person, place, and time.     Cranial Nerves: No cranial nerve deficit.  Psychiatric:     Comments: Considering his respiratory distress, he seems quite normal.     Results for orders placed or performed during the hospital encounter of 07/08/19 (from the past 48 hour(s))  Lactic acid, plasma     Status: None   Collection Time: 07/08/19 10:33 AM  Result Value Ref Range   Lactic Acid, Venous 1.3 0.5 - 1.9 mmol/L    Comment: Performed at [facility], [address], [place], [place] [address]  CBC WITH DIFFERENTIAL     Status: Abnormal   Collection Time: 07/08/19 10:33 AM  Result Value Ref Range   WBC 11.5 (H) 4.0 - 10.5 K/uL   RBC 5.37 4.22 - 5.81 MIL/uL   Hemoglobin 14.8 13.0 - 17.0 g/dL   HCT 60.4 54.0 - 98.1 %   MCV 83.8 80.0 - 100.0 fL   MCH 27.6 26.0 - 34.0 pg   MCHC 32.9 30.0 - 36.0 g/dL   RDW 19.1 47.8 - 29.5 %   Platelets 130 (L) 150 - 400 K/uL   nRBC 0.0 0.0 - 0.2 %   Neutrophils Relative % 92 %   Neutro Abs 10.5 (H) 1.7 - 7.7 K/uL   Lymphocytes Relative 5 %    Lymphs Abs 0.6 (L) 0.7 - 4.0 K/uL   Monocytes Relative 3 %   Monocytes Absolute 0.3 0.1 - 1.0 K/uL   Eosinophils Relative 0 %   Eosinophils Absolute 0.0 0.0 - 0.5 K/uL   Basophils Relative 0 %   Basophils Absolute 0.0 0.0 - 0.1 K/uL   Immature Granulocytes 0 %   Abs Immature Granulocytes 0.04 0.00 - 0.07 K/uL    Comment: Performed at [facility], [address] [place], [place] [address]  Comprehensive metabolic panel     Status: Abnormal   Collection Time: 07/08/19 10:33 AM  Result Value Ref Range   Sodium 133 (L) 135 - 145 mmol/L   Potassium 3.6 3.5 - 5.1 mmol/L   Chloride 101 98 - 111 mmol/L   CO2 20 (L) 22 - 32 mmol/L   Glucose, Bld 143 (H) 70 - 99 mg/dL   BUN 13 6 - 20 mg/dL   Creatinine, Ser 1.09 0.61 - 1.24 mg/dL   Calcium 8.5 (L) 8.9 - 10.3 mg/dL   Total Protein 7.6 6.5 - 8.1 g/dL   Albumin 3.3 (L) 3.5 - 5.0 g/dL   AST 76 (H) 15 - 41 U/L   ALT 96 (H) 0 - 44 U/L   Alkaline Phosphatase 97 38 - 126 U/L   Total Bilirubin 1.1 0.3 - 1.2 mg/dL   GFR calc non Af Amer >60 >60 mL/min   GFR calc Af Amer >60 >60 mL/min   Anion gap 12 5 - 15    Comment: Performed at [facility], [place] [address] [place], [place] [address]  D-dimer, quantitative     Status: Abnormal   Collection Time: 07/08/19 10:33 AM  Result Value Ref Range   D-Dimer, Quant 0.65 (H) 0.00 - 0.50 ug/mL-FEU    Comment: (NOTE) At the manufacturer cut-off of 0.50 ug/mL FEU, this assay has been documented to exclude PE with a sensitivity and negative predictive value of 97 to 99%.  At this time, this assay has not been approved by the FDA to exclude DVT/VTE. Results should be correlated with clinical presentation. Performed at [facility], [place] [address] [place], [place] [address]   Procalcitonin     Status: None   Collection Time: 07/08/19 10:33 AM  Result Value Ref Range   Procalcitonin 0.37 ng/mL    Comment:        Interpretation: PCT  (Procalcitonin) <= 0.5 ng/mL: Systemic infection (sepsis) is not likely. Local bacterial infection is possible. (NOTE)       Sepsis PCT Algorithm           Lower Respiratory Tract                                      Infection PCT Algorithm    ----------------------------     ----------------------------         PCT < 0.25 ng/mL                PCT < 0.10 ng/mL         Strongly encourage             Strongly discourage   discontinuation of antibiotics    initiation of antibiotics    ----------------------------     -----------------------------       PCT 0.25 - 0.50 ng/mL            PCT 0.10 - 0.25 ng/mL               OR       >80% decrease in PCT            Discourage initiation of                                            antibiotics      Encourage discontinuation  of antibiotics    ----------------------------     -----------------------------         PCT >= 0.50 ng/mL              PCT 0.26 - 0.50 ng/mL               AND        <80% decrease in PCT             Encourage initiation of                                             antibiotics       Encourage continuation           of antibiotics    ----------------------------     -----------------------------        PCT >= 0.50 ng/mL                  PCT > 0.50 ng/mL               AND         increase in PCT                  Strongly encourage                                      initiation of antibiotics    Strongly encourage escalation           of antibiotics                                     -----------------------------                                           PCT <= 0.25 ng/mL                                                 OR                                        > 80% decrease in PCT                                     Discontinue / Do not initiate                                             antibiotics Performed at [facility], [address], [place], [place] [address]   Lactate  dehydrogenase     Status: Abnormal   Collection Time: 07/08/19 10:33 AM  Result Value Ref Range   LDH 323 (H) 98 - 192 U/L  Comment: Performed at [facility], [address] [place], [place] [id number]  Ferritin     Status: Abnormal   Collection Time: 07/08/19 10:33 AM  Result Value Ref Range   Ferritin 1,961 (H) 24 - 336 ng/mL    Comment: Performed at [facility], [address] [place], [place] [id number]  Triglycerides     Status: None   Collection Time: 07/08/19 10:33 AM  Result Value Ref Range   Triglycerides 56 <150 mg/dL    Comment: Performed at [facility], [address] [place], [place] [id number]  Fibrinogen     Status: Abnormal   Collection Time: 07/08/19 10:33 AM  Result Value Ref Range   Fibrinogen >800 (H) 210 - 475 mg/dL    Comment: Performed at [facility], [address] [place], [place] [id number]  C-reactive protein     Status: Abnormal   Collection Time: 07/08/19 10:33 AM  Result Value Ref Range   CRP 22.4 (H) <1.0 mg/dL    Comment: Performed at [facility], [address] [place], [place] [address]  Lipase, blood     Status: None   Collection Time: 07/08/19 10:33 AM  Result Value Ref Range   Lipase 27 11 - 51 U/L    Comment: Performed at [facility], [address] [place], [place] [address]   Ct Angio Chest Pe W And/or Wo Contrast  Result Date: 07/08/2019 CLINICAL DATA:  Shortness of breath. COVID-19 positive. Abdominal pain and diarrhea EXAM: CT ANGIOGRAPHY CHEST CT ABDOMEN AND PELVIS WITH CONTRAST TECHNIQUE: Multidetector CT imaging of the chest was performed using the standard protocol during bolus administration of intravenous contrast. Multiplanar CT image reconstructions and MIPs were obtained to evaluate the vascular anatomy. Multidetector CT imaging of the abdomen and pelvis was performed using the standard protocol during bolus  administration of intravenous contrast. CONTRAST:  100mL OMNIPAQUE IOHEXOL 350 MG/ML SOLN COMPARISON:  Chest radiograph July 08, 2019; CT abdomen and pelvis November 27, 2015 FINDINGS: CTA CHEST FINDINGS Cardiovascular: There is no demonstrable pulmonary embolus. There is no thoracic aortic aneurysm or dissection. The visualized great vessels appear normal. There is no pericardial effusion or pericardial thickening. Mediastinum/Nodes: There is a nodular opacity in the right lobe of the thyroid measuring 1.8 x 1.5 cm. There is no demonstrable thoracic adenopathy by size criteria. There are occasional subcentimeter mediastinal lymph nodes. No esophageal lesions are evident. Lungs/Pleura: There is extensive airspace opacity throughout the lungs diffusely with the greatest concentration of airspace opacity in both lower lobes. The appearance of the airspace disease in the upper lobes and in the right middle lobe is consistent with ground-glass type opacity. There is a mixture ground-glass opacity and relative consolidation in both lower lobes and in the inferior lingula. There are no appreciable pleural effusions. Musculoskeletal: No blastic or lytic bone lesions are evident. No chest wall lesions. Review of the MIP images confirms the above findings. CT ABDOMEN and PELVIS FINDINGS Hepatobiliary: No focal liver lesions are evident. Gallbladder wall is not appreciably thickened. There is no biliary duct dilatation. Pancreas: No pancreatic mass or inflammatory focus. Spleen: No splenic lesions are evident. Adrenals/Urinary Tract: Adrenals bilaterally appear normal. There is a 7 mm cyst in the upper pole of the left kidney. There is a 6 mm cyst in the lower pole left kidney. There is no evident hydronephrosis on either side. There is no appreciable renal or ureteral calculus on either side.  Urinary bladder is midline with wall thickness within normal limits. Stomach/Bowel: There is no appreciable bowel wall or mesenteric  thickening. 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 Duodenum does not pass between the superior mesenteric artery and aorta. The cecum is located in the mid abdomen. No evident bowel obstruction. Terminal ileum appears unremarkable. There is no evident free air or portal venous air. Vascular/Lymphatic: No abdominal aortic aneurysm. No vascular lesions evident. No adenopathy appreciable in the abdomen or pelvis. Reproductive: There are prostatic calculi. Prostate and seminal vesicles are normal in size and contour. No evident pelvic mass. Other: No periappendiceal region inflammatory change. No abscess or ascites in the abdomen or pelvis. There is mild fat in the umbilicus. Musculoskeletal: No blastic or lytic bone lesions evident. No intramuscular or abdominal wall lesions. Review of the MIP images confirms the above findings. IMPRESSION: CT angiogram chest: 1. No demonstrable pulmonary embolus. No thoracic aortic aneurysm or dissection. 2. Multifocal airspace disease consistent with extensive bilateral multifocal pneumonia. Mixture of consolidation and ground-glass type opacities. 3. No evident adenopathy by size criteria. 4. Nodule in the right lobe of the thyroid measuring 1.8 x 1.5 cm. Nonemergent thyroid ultrasound advised given this finding. 5. CT abdomen and pelvis: 1. 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 No bowel dilatation or bowel obstruction. No abscess in the abdomen or pelvis. No internal hernia evident. 2. No evident renal or ureteral calculus. No hydronephrosis. Urinary bladder wall thickness normal. There are prostatic calculi. Electronically Signed   By: Bretta Bang III M.D.   On: 07/08/2019 14:22   Ct Abdomen Pelvis W Contrast  Result Date: 07/08/2019 CLINICAL DATA:  Shortness of breath. COVID-19 positive. Abdominal pain and diarrhea EXAM: CT ANGIOGRAPHY CHEST CT ABDOMEN AND PELVIS WITH CONTRAST TECHNIQUE: Multidetector CT imaging of the chest was performed using the standard  protocol during bolus administration of intravenous contrast. Multiplanar CT image reconstructions and MIPs were obtained to evaluate the vascular anatomy. Multidetector CT imaging of the abdomen and pelvis was performed using the standard protocol during bolus administration of intravenous contrast. CONTRAST:  OMNIPAQUE IOHEXOL 350 MG/ML SOLN COMPARISON:  Chest radiograph July 08, 2019; CT abdomen and pelvis November 27, 2015 FINDINGS: CTA CHEST FINDINGS Cardiovascular: There is no demonstrable pulmonary embolus. There is no thoracic aortic aneurysm or dissection. The visualized great vessels appear normal. There is no pericardial effusion or pericardial thickening. Mediastinum/Nodes: There is a nodular opacity in the right lobe of the thyroid measuring 1.8 x 1.5 cm. There is no demonstrable thoracic adenopathy by size criteria. There are occasional subcentimeter mediastinal lymph nodes. No esophageal lesions are evident. Lungs/Pleura: There is extensive airspace opacity throughout the lungs diffusely with the greatest concentration of airspace opacity in both lower lobes. The appearance of the airspace disease in the upper lobes and in the right middle lobe is consistent with ground-glass type opacity. There is a mixture ground-glass opacity and relative consolidation in both lower lobes and in the inferior lingula. There are no appreciable pleural effusions. Musculoskeletal: No blastic or lytic bone lesions are evident. No chest wall lesions. Review of the MIP images confirms the above findings. CT ABDOMEN and PELVIS FINDINGS Hepatobiliary: No focal liver lesions are evident. Gallbladder wall is not appreciably thickened. There is no biliary duct dilatation. Pancreas: No pancreatic mass or inflammatory focus. Spleen: No splenic lesions are evident. Adrenals/Urinary Tract: Adrenals bilaterally appear normal. There is a 7 mm cyst in the upper pole of the left kidney. There is a 6  mm cyst in the lower pole  left kidney. There is no evident hydronephrosis on either side. There is no appreciable renal or ureteral calculus on either side. Urinary bladder is midline with wall thickness within normal limits. Stomach/Bowel: There is no appreciable bowel wall or mesenteric thickening. 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 Duodenum does not pass between the superior mesenteric artery and aorta. The cecum is located in the mid abdomen. No evident bowel obstruction. Terminal ileum appears unremarkable. There is no evident free air or portal venous air. Vascular/Lymphatic: No abdominal aortic aneurysm. No vascular lesions evident. No adenopathy appreciable in the abdomen or pelvis. Reproductive: There are prostatic calculi. Prostate and seminal vesicles are normal in size and contour. No evident pelvic mass. Other: No periappendiceal region inflammatory change. No abscess or ascites in the abdomen or pelvis. There is mild fat in the umbilicus. Musculoskeletal: No blastic or lytic bone lesions evident. No intramuscular or abdominal wall lesions. Review of the MIP images confirms the above findings. IMPRESSION: CT angiogram chest: 1. No demonstrable pulmonary embolus. No thoracic aortic aneurysm or dissection. 2. Multifocal airspace disease consistent with extensive bilateral multifocal pneumonia. Mixture of consolidation and ground-glass type opacities. 3. No evident adenopathy by size criteria. 4. Nodule in the right lobe of the thyroid measuring 1.8 x 1.5 cm. Nonemergent thyroid ultrasound advised given this finding. 5. CT abdomen and pelvis: 1. 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 No bowel dilatation or bowel obstruction. No abscess in the abdomen or pelvis. No internal hernia evident. 2. No evident renal or ureteral calculus. No hydronephrosis. Urinary bladder wall thickness normal. There are prostatic calculi. Electronically Signed   By: Bretta Bang III M.D.   On: 07/08/2019 14:22   Dg Chest Port 1  View  Result Date: 07/08/2019 CLINICAL DATA:  Shortness of breath, COVID-19 positive EXAM: PORTABLE CHEST 1 VIEW COMPARISON:  04/13/2018 FINDINGS: Heart size is mildly enlarged. Patchy bilateral airspace opacities in a predominantly perihilar and bibasilar distribution. Lung volumes are low. No pleural effusion or pneumothorax. IMPRESSION: Patchy bilateral airspace opacities suspicious for multifocal pneumonia. Electronically Signed   By: Duanne Guess M.D.   On: 07/08/2019 11:05      Assessment/Plan  Sepsis Acute hypoxic respiratory distress Covid 19 positive 06/30/19 Atrial fibrillation with RVR -currently in sinus rhythm Abdominal pain/diarrhea  Plan: Dr. 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  There is no volvulus.  There is no intra-abdominal pathology on the CT.  On the exam patient is coughing and has abdominal discomfort, but he does not have acute peritonitis, or abdominal distention.  He does not have an acute abdomen.  He does not have a surgical problem at this time.  Please call if there is a change in his exam, or we can be of further assistance.    Sherrie George [facility] Surgery 07/08/2019, 3:44 PM Please see Amion for pager number during day hours 7:00am-4:30pm

## 2019-07-08 NOTE — ED Notes (Signed)
Increased O2 from 3L/[place] to 5L/[place]

## 2019-07-08 NOTE — H&P (Signed)
TRH H&P    Patient Demographics:    Terry Delgado, is a 53 y.o. male  MRN: [id number]  DOB - 16-Dec-1965  Admit Date - 07/08/2019  Referring MD/NP/PA: Matthias Hughs  Outpatient Primary MD for the patient is Laurann Montana, MD  Patient coming from: Home  Chief complaint-shortness of breath   HPI:    Terry Delgado  is a 53 y.o. male, with history of atrial fibrillation s/p ablation on 04/02/2019, not on anticoagulation who presented with fever cough and shortness of breath.  Patient says that he was diagnosed with Covid on 06/30/2019.  His wife is a Engineer, civil (consulting) who also had COVID-19.  Patient says that he has noticed worsening symptoms of Covid for past 5 days he has had worsening shortness of breath.  He also coughed up blood few days ago.  Patient says that since his diagnosis of Covid he had multiple episodes of heart palpitations.  He has been having multiple episodes of diarrhea.  Denies nausea and vomiting.  Denies headache or blurred vision.  No chest pain.  In the ED COVID-19 test was positive.  Chest x-ray showed multifocal pneumonia.  Procalcitonin 0.37, ferritin 1961, LDH 323, triglyceride 56, CRP 22.4  Patient was found to be hypoxic and requiring nonrebreather. He also complained of abdominal distention and pain.  CT scan of the abdomen showed degree of bowel malrotation no bowel dilation or bowel obstruction.  General surgery was consulted and no surgical intervention recommended at this time.     Review of systems:    In addition to the HPI above,    All other systems reviewed and are negative.    Past History of the following :    Past Medical History:  Diagnosis Date   A-fib (HCC)    Allergy    GERD (gastroesophageal reflux disease)    Hypertension       Past Surgical History:  Procedure Laterality Date   STOMACH SURGERY     infant      Social History:      Social  History   Tobacco Use   Smoking status: Never Smoker   Smokeless tobacco: Never Used  Substance Use Topics   Alcohol use: Yes    Alcohol/week: 1.0 standard drinks    Types: 1 Cans of beer per week    Comment: occ       Family History :     Family History  Problem Relation Age of Onset   Cancer Mother    Hypertension Father    Diabetes Father    COPD Brother    Depression Brother       Home Medications:   Prior to Admission medications   Medication Sig Start Date End Date Taking? Authorizing Provider  aspirin EC 81 MG tablet Take 81 mg by mouth daily.   Yes [provider]  esomeprazole (NEXIUM) 20 MG capsule Take 20 mg by mouth daily.   Yes [provider]  fexofenadine (ALLEGRA) 30 MG tablet Take 30 mg by mouth daily as needed (seasonal allergies).  Reported on 11/27/2015   Yes [provider]  fluticasone (FLONASE) 50 MCG/ACT nasal spray Place 1 spray into both nostrils daily as needed for allergies or rhinitis.   Yes [provider]  ibuprofen (ADVIL,MOTRIN) 200 MG tablet Take 400 mg by mouth daily as needed for mild pain.   Yes [provider]     Allergies:     Allergies  Allergen Reactions   Penicillins Rash    Has patient had a PCN reaction causing immediate rash, facial/tongue/throat swelling, SOB or lightheadedness with hypotension: unknown Has patient had a PCN reaction causing severe rash involving mucus membranes or skin necrosis: No Has patient had a PCN reaction that required hospitalization No Has patient had a PCN reaction occurring within the last 10 years: No If all of the above answers are "NO", then may proceed with Cephalosporin use.     Physical Exam:   Vitals  Blood pressure (!) 156/79, pulse (!) 124, temperature (!) 101.5 F (38.6 C), temperature source Oral, resp. rate (!) 26, SpO2 93 %.  1.  General: Appears in no acute distress  2. Psychiatric: Alert, oriented x3, intact insight  and judgment  3. Neurologic: Cranial nerves II through XII grossly intact, no focal deficit noted  4. HEENMT:  Atraumatic normocephalic, extraocular muscles are intact  5. Respiratory : Clear to auscultation bilaterally, no wheezing or crackles  6. Cardiovascular : S1-S2, regular, no murmur auscultated  7. Gastrointestinal:  Abdomen is distended, nontender to palpation, no rigidity or guarding, no organomegaly     Data Review:    CBC Recent Labs  Lab 07/08/19 1033  WBC 11.5*  HGB 14.8  HCT 45.0  PLT 130*  MCV 83.8  MCH 27.6  MCHC 32.9  RDW 12.8  LYMPHSABS 0.6*  MONOABS 0.3  EOSABS 0.0  BASOSABS 0.0   ------------------------------------------------------------------------------------------------------------------  Results for orders placed or performed during the hospital encounter of 07/08/19 (from the past 48 hour(s))  Lactic acid, plasma     Status: None   Collection Time: 07/08/19 10:33 AM  Result Value Ref Range   Lactic Acid, Venous 1.3 0.5 - 1.9 mmol/L    Comment: Performed at [facility], [address], [place], [place] [address]  CBC WITH DIFFERENTIAL     Status: Abnormal   Collection Time: 07/08/19 10:33 AM  Result Value Ref Range   WBC 11.5 (H) 4.0 - 10.5 K/uL   RBC 5.37 4.22 - 5.81 MIL/uL   Hemoglobin 14.8 13.0 - 17.0 g/dL   HCT 60.4 54.0 - 98.1 %   MCV 83.8 80.0 - 100.0 fL   MCH 27.6 26.0 - 34.0 pg   MCHC 32.9 30.0 - 36.0 g/dL   RDW 19.1 47.8 - 29.5 %   Platelets 130 (L) 150 - 400 K/uL   nRBC 0.0 0.0 - 0.2 %   Neutrophils Relative % 92 %   Neutro Abs 10.5 (H) 1.7 - 7.7 K/uL   Lymphocytes Relative 5 %   Lymphs Abs 0.6 (L) 0.7 - 4.0 K/uL   Monocytes Relative 3 %   Monocytes Absolute 0.3 0.1 - 1.0 K/uL   Eosinophils Relative 0 %   Eosinophils Absolute 0.0 0.0 - 0.5 K/uL   Basophils Relative 0 %   Basophils Absolute 0.0 0.0 - 0.1 K/uL   Immature Granulocytes 0 %   Abs Immature Granulocytes 0.04 0.00 - 0.07 K/uL     Comment: Performed at [facility], [address], [place], [place] [address]  Comprehensive metabolic panel     Status: Abnormal   Collection Time: 07/08/19 10:33 AM  Result Value Ref Range   Sodium 133 (L) 135 - 145 mmol/L   Potassium 3.6 3.5 - 5.1 mmol/L   Chloride 101 98 - 111 mmol/L   CO2 20 (L) 22 - 32 mmol/L   Glucose, Bld 143 (H) 70 - 99 mg/dL   BUN 13 6 - 20 mg/dL   Creatinine, Ser 1.09 0.61 - 1.24 mg/dL   Calcium 8.5 (L) 8.9 - 10.3 mg/dL   Total Protein 7.6 6.5 - 8.1 g/dL   Albumin 3.3 (L) 3.5 - 5.0 g/dL   AST 76 (H) 15 - 41 U/L   ALT 96 (H) 0 - 44 U/L   Alkaline Phosphatase 97 38 - 126 U/L   Total Bilirubin 1.1 0.3 - 1.2 mg/dL   GFR calc non Af Amer >60 >60 mL/min   GFR calc Af Amer >60 >60 mL/min   Anion gap 12 5 - 15    Comment: Performed at [facility], [place] [address], [place], [place] [address]  D-dimer, quantitative     Status: Abnormal   Collection Time: 07/08/19 10:33 AM  Result Value Ref Range   D-Dimer, Quant 0.65 (H) 0.00 - 0.50 ug/mL-FEU    Comment: (NOTE) At the manufacturer cut-off of 0.50 ug/mL FEU, this assay has been documented to exclude PE with a sensitivity and negative predictive value of 97 to 99%.  At this time, this assay has not been approved by the FDA to exclude DVT/VTE. Results should be correlated with clinical presentation. Performed at [facility], [place] [address] [place], [place] [address]   Procalcitonin     Status: None   Collection Time: 07/08/19 10:33 AM  Result Value Ref Range   Procalcitonin 0.37 ng/mL    Comment:        Interpretation: PCT (Procalcitonin) <= 0.5 ng/mL: Systemic infection (sepsis) is not likely. Local bacterial infection is possible. (NOTE)       Sepsis PCT Algorithm           Lower Respiratory Tract                                      Infection PCT Algorithm    ----------------------------     ----------------------------         PCT < 0.25  ng/mL                PCT < 0.10 ng/mL         Strongly encourage             Strongly discourage   discontinuation of antibiotics    initiation of antibiotics    ----------------------------     -----------------------------       PCT 0.25 - 0.50 ng/mL            PCT 0.10 - 0.25 ng/mL               OR       >80% decrease in PCT            Discourage initiation of                                            antibiotics  Encourage discontinuation           of antibiotics    ----------------------------     -----------------------------         PCT >= 0.50 ng/mL              PCT 0.26 - 0.50 ng/mL               AND        <80% decrease in PCT             Encourage initiation of                                             antibiotics       Encourage continuation           of antibiotics    ----------------------------     -----------------------------        PCT >= 0.50 ng/mL                  PCT > 0.50 ng/mL               AND         increase in PCT                  Strongly encourage                                      initiation of antibiotics    Strongly encourage escalation           of antibiotics                                     -----------------------------                                           PCT <= 0.25 ng/mL                                                 OR                                        > 80% decrease in PCT                                     Discontinue / Do not initiate                                             antibiotics Performed at [facility], [address] [place], [place] [address]   Lactate dehydrogenase     Status: Abnormal   Collection Time: 07/08/19 10:33 AM  Result Value Ref Range  LDH 323 (H) 98 - 192 U/L    Comment: Performed at [facility], [address] [place], [place] [address]  Ferritin     Status: Abnormal   Collection Time: 07/08/19 10:33 AM  Result Value Ref Range   Ferritin  1,961 (H) 24 - 336 ng/mL    Comment: Performed at [facility], [address] [place], [place] [address]  Triglycerides     Status: None   Collection Time: 07/08/19 10:33 AM  Result Value Ref Range   Triglycerides 56 <150 mg/dL    Comment: Performed at [facility], [address], [place], [place] [address]  Fibrinogen     Status: Abnormal   Collection Time: 07/08/19 10:33 AM  Result Value Ref Range   Fibrinogen >800 (H) 210 - 475 mg/dL    Comment: Performed at [facility], [address], [place], [place] [address]  C-reactive protein     Status: Abnormal   Collection Time: 07/08/19 10:33 AM  Result Value Ref Range   CRP 22.4 (H) <1.0 mg/dL    Comment: Performed at [facility], [address] [place], [place] [address]  Lipase, blood     Status: None   Collection Time: 07/08/19 10:33 AM  Result Value Ref Range   Lipase 27 11 - 51 U/L    Comment: Performed at [facility], [address] [place], [place] [address]    Chemistries  Recent Labs  Lab 07/08/19 1033  NA 133*  K 3.6  CL 101  CO2 20*  GLUCOSE 143*  BUN 13  CREATININE 1.09  CALCIUM 8.5*  AST 76*  ALT 96*  ALKPHOS 97  BILITOT 1.1   ------------------------------------------------------------------------------------------------------------------  ------------------------------------------------------------------------------------------------------------------ GFR: CrCl cannot be calculated (Unknown ideal weight.). Liver Function Tests: Recent Labs  Lab 07/08/19 1033  AST 76*  ALT 96*  ALKPHOS 97  BILITOT 1.1  PROT 7.6  ALBUMIN 3.3*   Recent Labs  Lab 07/08/19 1033  LIPASE 27   Lipid Profile: Recent Labs    07/08/19 1033  TRIG 56   Thyroid Function Tests: No results for input(s): TSH, T4TOTAL, FREET4, T3FREE, THYROIDAB in the last 72 hours. Anemia Panel: Recent Labs    07/08/19 1033    FERRITIN 1,961*    --------------------------------------------------------------------------------------------------------------- Urine analysis:    Component Value Date/Time   COLORURINE YELLOW 11/27/2015 0859   APPEARANCEUR CLEAR 11/27/2015 0859   LABSPEC 1.021 11/27/2015 0859   PHURINE 7.0 11/27/2015 0859   GLUCOSEU NEGATIVE 11/27/2015 0859   HGBUR NEGATIVE 11/27/2015 0859   BILIRUBINUR NEGATIVE 11/27/2015 0859   KETONESUR NEGATIVE 11/27/2015 0859   PROTEINUR NEGATIVE 11/27/2015 0859   NITRITE NEGATIVE 11/27/2015 0859   LEUKOCYTESUR NEGATIVE 11/27/2015 0859      Imaging Results:    Ct Angio Chest Pe W And/or Wo Contrast  Result Date: 07/08/2019 CLINICAL DATA:  Shortness of breath. COVID-19 positive. Abdominal pain and diarrhea EXAM: CT ANGIOGRAPHY CHEST CT ABDOMEN AND PELVIS WITH CONTRAST TECHNIQUE: Multidetector CT imaging of the chest was performed using the standard protocol during bolus administration of intravenous contrast. Multiplanar CT image reconstructions and MIPs were obtained to evaluate the vascular anatomy. Multidetector CT imaging of the abdomen and pelvis was performed using the standard protocol during bolus administration of intravenous contrast. CONTRAST:  OMNIPAQUE IOHEXOL 350 MG/ML SOLN COMPARISON:  Chest radiograph July 08, 2019; CT abdomen and pelvis November 27, 2015 FINDINGS: CTA CHEST FINDINGS Cardiovascular: There is no  demonstrable pulmonary embolus. There is no thoracic aortic aneurysm or dissection. The visualized great vessels appear normal. There is no pericardial effusion or pericardial thickening. Mediastinum/Nodes: There is a nodular opacity in the right lobe of the thyroid measuring 1.8 x 1.5 cm. There is no demonstrable thoracic adenopathy by size criteria. There are occasional subcentimeter mediastinal lymph nodes. No esophageal lesions are evident. Lungs/Pleura: There is extensive airspace opacity throughout the lungs diffusely with the  greatest concentration of airspace opacity in both lower lobes. The appearance of the airspace disease in the upper lobes and in the right middle lobe is consistent with ground-glass type opacity. There is a mixture ground-glass opacity and relative consolidation in both lower lobes and in the inferior lingula. There are no appreciable pleural effusions. Musculoskeletal: No blastic or lytic bone lesions are evident. No chest wall lesions. Review of the MIP images confirms the above findings. CT ABDOMEN and PELVIS FINDINGS Hepatobiliary: No focal liver lesions are evident. Gallbladder wall is not appreciably thickened. There is no biliary duct dilatation. Pancreas: No pancreatic mass or inflammatory focus. Spleen: No splenic lesions are evident. Adrenals/Urinary Tract: Adrenals bilaterally appear normal. There is a 7 mm cyst in the upper pole of the left kidney. There is a 6 mm cyst in the lower pole left kidney. There is no evident hydronephrosis on either side. There is no appreciable renal or ureteral calculus on either side. Urinary bladder is midline with wall thickness within normal limits. Stomach/Bowel: There is no appreciable bowel wall or mesenteric thickening. 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 Duodenum does not pass between the superior mesenteric artery and aorta. The cecum is located in the mid abdomen. No evident bowel obstruction. Terminal ileum appears unremarkable. There is no evident free air or portal venous air. Vascular/Lymphatic: No abdominal aortic aneurysm. No vascular lesions evident. No adenopathy appreciable in the abdomen or pelvis. Reproductive: There are prostatic calculi. Prostate and seminal vesicles are normal in size and contour. No evident pelvic mass. Other: No periappendiceal region inflammatory change. No abscess or ascites in the abdomen or pelvis. There is mild fat in the umbilicus. Musculoskeletal: No blastic or lytic bone lesions evident. No intramuscular or  abdominal wall lesions. Review of the MIP images confirms the above findings. IMPRESSION: CT angiogram chest: 1. No demonstrable pulmonary embolus. No thoracic aortic aneurysm or dissection. 2. Multifocal airspace disease consistent with extensive bilateral multifocal pneumonia. Mixture of consolidation and ground-glass type opacities. 3. No evident adenopathy by size criteria. 4. Nodule in the right lobe of the thyroid measuring 1.8 x 1.5 cm. Nonemergent thyroid ultrasound advised given this finding. 5. CT abdomen and pelvis: 1. 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 No bowel dilatation or bowel obstruction. No abscess in the abdomen or pelvis. No internal hernia evident. 2. No evident renal or ureteral calculus. No hydronephrosis. Urinary bladder wall thickness normal. There are prostatic calculi. Electronically Signed   By: Bretta BangWilliam  Woodruff III M.D.   On: 07/08/2019 14:22   Ct Abdomen Pelvis W Contrast  Result Date: 07/08/2019 CLINICAL DATA:  Shortness of breath. COVID-19 positive. Abdominal pain and diarrhea EXAM: CT ANGIOGRAPHY CHEST CT ABDOMEN AND PELVIS WITH CONTRAST TECHNIQUE: Multidetector CT imaging of the chest was performed using the standard protocol during bolus administration of intravenous contrast. Multiplanar CT image reconstructions and MIPs were obtained to evaluate the vascular anatomy. Multidetector CT imaging of the abdomen and pelvis was performed using the standard protocol during bolus administration of intravenous contrast. CONTRAST:  100mL  OMNIPAQUE IOHEXOL 350 MG/ML SOLN COMPARISON:  Chest radiograph July 08, 2019; CT abdomen and pelvis November 27, 2015 FINDINGS: CTA CHEST FINDINGS Cardiovascular: There is no demonstrable pulmonary embolus. There is no thoracic aortic aneurysm or dissection. The visualized great vessels appear normal. There is no pericardial effusion or pericardial thickening. Mediastinum/Nodes: There is a nodular opacity in the right lobe of the thyroid  measuring 1.8 x 1.5 cm. There is no demonstrable thoracic adenopathy by size criteria. There are occasional subcentimeter mediastinal lymph nodes. No esophageal lesions are evident. Lungs/Pleura: There is extensive airspace opacity throughout the lungs diffusely with the greatest concentration of airspace opacity in both lower lobes. The appearance of the airspace disease in the upper lobes and in the right middle lobe is consistent with ground-glass type opacity. There is a mixture ground-glass opacity and relative consolidation in both lower lobes and in the inferior lingula. There are no appreciable pleural effusions. Musculoskeletal: No blastic or lytic bone lesions are evident. No chest wall lesions. Review of the MIP images confirms the above findings. CT ABDOMEN and PELVIS FINDINGS Hepatobiliary: No focal liver lesions are evident. Gallbladder wall is not appreciably thickened. There is no biliary duct dilatation. Pancreas: No pancreatic mass or inflammatory focus. Spleen: No splenic lesions are evident. Adrenals/Urinary Tract: Adrenals bilaterally appear normal. There is a 7 mm cyst in the upper pole of the left kidney. There is a 6 mm cyst in the lower pole left kidney. There is no evident hydronephrosis on either side. There is no appreciable renal or ureteral calculus on either side. Urinary bladder is midline with wall thickness within normal limits. Stomach/Bowel: There is no appreciable bowel wall or mesenteric thickening. 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 Duodenum does not pass between the superior mesenteric artery and aorta. The cecum is located in the mid abdomen. No evident bowel obstruction. Terminal ileum appears unremarkable. There is no evident free air or portal venous air. Vascular/Lymphatic: No abdominal aortic aneurysm. No vascular lesions evident. No adenopathy appreciable in the abdomen or pelvis. Reproductive: There are prostatic calculi. Prostate and seminal vesicles are  normal in size and contour. No evident pelvic mass. Other: No periappendiceal region inflammatory change. No abscess or ascites in the abdomen or pelvis. There is mild fat in the umbilicus. Musculoskeletal: No blastic or lytic bone lesions evident. No intramuscular or abdominal wall lesions. Review of the MIP images confirms the above findings. IMPRESSION: CT angiogram chest: 1. No demonstrable pulmonary embolus. No thoracic aortic aneurysm or dissection. 2. Multifocal airspace disease consistent with extensive bilateral multifocal pneumonia. Mixture of consolidation and ground-glass type opacities. 3. No evident adenopathy by size criteria. 4. Nodule in the right lobe of the thyroid measuring 1.8 x 1.5 cm. Nonemergent thyroid ultrasound advised given this finding. 5. CT abdomen and pelvis: 1. 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 No bowel dilatation or bowel obstruction. No abscess in the abdomen or pelvis. No internal hernia evident. 2. No evident renal or ureteral calculus. No hydronephrosis. Urinary bladder wall thickness normal. There are prostatic calculi. Electronically Signed   By: Bretta Bang III M.D.   On: 07/08/2019 14:22   Dg Chest Port 1 View  Result Date: 07/08/2019 CLINICAL DATA:  Shortness of breath, COVID-19 positive EXAM: PORTABLE CHEST 1 VIEW COMPARISON:  04/13/2018 FINDINGS: Heart size is mildly enlarged. Patchy bilateral airspace opacities in a predominantly perihilar and bibasilar distribution. Lung volumes are low. No pleural effusion or pneumothorax. IMPRESSION: Patchy bilateral airspace opacities suspicious for multifocal  pneumonia. Electronically Signed   By: Duanne Guess M.D.   On: 07/08/2019 11:05    My personal review of EKG: Rhythm normal sinus rhythm   Assessment & Plan:    Active Problems:   Pneumonia due to COVID-19 virus   Congenital malrotation - partial   1. Pneumonia due to COVID-19-chest x-ray shows multifocal pneumonia, procalcitonin 0.37.  Will  start remdesivir per pharmacy consultation, Decadron 6 mg daily.  Continue supplemental oxygen via nonrebreather.  Patient will be transferred to [facility].  Will obtain D-dimer, CRP in a.m.  2. History of atrial fibrillation-s/p ablation, having palpitations since she was diagnosed with COVID-19.  Will obtain echocardiogram in a.m. to rule out myopericarditis.  Monitor closely on telemetry.  Patient is not on anticoagulation at home.CHA2DS2VASc score 0.  3. Abdominal distention-patient complained of abdominal pain and distention.  CT scan abdomen obtained showed mild partial malrotation likely congenital.  No evidence of volvulus.  General surgery was consulted, no surgical intervention recommended at this time.    DVT Prophylaxis-   Lovenox   AM Labs Ordered, also please review Full Orders  Family Communication: Admission, patients condition and plan of care including tests being ordered have been discussed with the patient who indicate understanding and agree with the plan and Code Status.  Code Status: Full code  Admission status: Inpatient: Based on patients clinical presentation and evaluation of above clinical data, I have made determination that patient meets Inpatient criteria at this time.  Time spent in minutes : 60 minutes   Meredeth Ide M.D on 07/08/2019 at 5:11 PM

## 2019-07-08 NOTE — ED Notes (Signed)
Increased from from 5L [place] to 7L [place]

## 2019-07-08 NOTE — ED Notes (Addendum)
Placed pt on Non-rebreather 10L

## 2019-07-08 NOTE — ED Notes (Signed)
ED Provider at bedside. 

## 2019-07-08 NOTE — ED Provider Notes (Addendum)
[facility] DEPT Provider Note   CSN: [id number] Arrival date & time: 07/08/19  3762   History   Chief Complaint Shortness of breath  HPI Terry Delgado is a 53 y.o. male with past medical history significant for A. Fib not on anticoagulation, s/p ablation on 7/24 who presents for evaluation of fever, cough and shortness of breath.  Patient known Covid positive, diagnosed on 10/21.  Patient states he has had worsening shortness of breath over the last 5 days.  Shortness of breath is worse with ambulation.  He denies any chest pain or hemoptysis.  Patient states he has not any episodes of a flutter or A. fib since he was plated in July until his Covid diagnosis where he has had multiple episodes where he feels heart palpitations.  Takes a baby aspirin however does not take any additional anticoagulation.  Has had multiple episodes of diarrhea which is without melena or hematochezia.  Had decreased appetite however has been able to tolerate p.o. intake.  He has had low-grade fevers at home however has not taken his temperature.  Denies headache, vision changes, neck pain, neck stiffness, congestion, rhinorrhea, chest pain, hemoptysis, dysuria, new lateral weakness.  He did take Tylenol at 8 AM this morning.  Denies additional aggravating or alleviating factors.  History obtained from patient and past medical records.  No interpreter is used.     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 Authorizing Provider  aspirin EC 81 MG tablet Take 81 mg by mouth daily.   Yes [provider]  esomeprazole (NEXIUM) 20 MG capsule Take 20 mg by mouth daily.   Yes [provider]  fexofenadine (ALLEGRA) 30 MG tablet Take 30 mg by mouth daily as needed (seasonal allergies). Reported on 11/27/2015   Yes [provider]  fluticasone (FLONASE) 50 MCG/ACT nasal spray Place 1 spray into both nostrils daily as needed for allergies or rhinitis.   Yes [provider]  ibuprofen (ADVIL,MOTRIN) 200 MG tablet Take 400 mg by mouth daily as needed for mild pain.   Yes [provider]    Family History Family History  Problem Relation Age of Onset   Cancer Mother    Hypertension Father    Diabetes Father    COPD Brother    Depression Brother     Social History Social History   Tobacco Use   Smoking status: Never Smoker   Smokeless tobacco: Never Used  Substance Use Topics   Alcohol use: Yes    Alcohol/week: 1.0 standard drinks    Types: 1 Cans of beer per week    Comment: occ   Drug use: No     Allergies   Penicillins   Review of Systems Review of Systems  Constitutional: Positive  for activity change, appetite change, chills and fever.  HENT: Negative.   Respiratory: Positive for cough and shortness of breath. Negative for apnea, choking, chest tightness, wheezing and stridor.   Gastrointestinal: Positive for abdominal pain and diarrhea. Negative for abdominal distention, anal bleeding, blood in stool, constipation, nausea, rectal pain and vomiting.  Genitourinary: Negative.   Musculoskeletal: Negative.   Skin: Negative.   Neurological: Negative.   All other systems reviewed and are negative.    Physical Exam Updated Vital Signs BP (!) 156/79    Pulse (!) 124    Temp (!) 101.5 F (38.6 C)  (Oral)    Resp (!) 26    SpO2 93%   Physical Exam Vitals signs and nursing note reviewed.  Constitutional:      General: He is not in acute distress.    Appearance: He is ill-appearing. He is not toxic-appearing or diaphoretic.  HENT:     Head: Normocephalic and atraumatic.     Jaw: There is normal jaw occlusion.     Right Ear: Tympanic membrane, ear canal and external ear normal. There is no impacted cerumen. No hemotympanum. Tympanic membrane is not injected, scarred, perforated, erythematous, retracted or bulging.     Left Ear: Tympanic membrane, ear canal and external ear normal. There is no impacted cerumen. No hemotympanum. Tympanic membrane is not injected, scarred, perforated, erythematous, retracted or bulging.     Ears:     Comments: No Mastoid tenderness.    Nose:     Comments: Clear rhinorrhea and congestion to bilateral nares.  No sinus tenderness.    Mouth/Throat:     Comments: Posterior oropharynx clear.  Mucous membranes dry.Tonsils without erythema or exudate.  Uvula midline without deviation. Phonation normal. Neck:     Trachea: Trachea and phonation normal.     Meningeal: Brudzinski's sign and Kernig's sign absent.     Comments: No Neck stiffness or neck rigidity.  No meningismus.  No cervical lymphadenopathy. Cardiovascular:     Rate and Rhythm: Tachycardia present.     Heart sounds: Normal heart sounds.     Comments: No murmurs rubs or gallops. Pulmonary:     Effort: Pulmonary effort is normal. Tachypnea present.     Breath sounds: Normal breath sounds.     Comments: Clear to auscultation bilaterally without wheeze, rhonchi or rales.  No accessory muscle usage.  Abdominal:     Palpations: Abdomen is soft.     Tenderness: There is generalized abdominal tenderness. There is no right CVA tenderness or left CVA tenderness.     Hernia: No hernia is present.     Comments: Soft, without rebound or guarding.  No CVA tenderness.   Musculoskeletal:     Comments: Moves  all 4 extremities without difficulty.  Lower extremities without edema, erythema or warmth. Homans Negative.  Calves nontender without erythema or warmth bilaterally.  Skin:    Comments: Brisk capillary refill.  No rashes or lesions.  Neurological:     Mental Status: He is alert.     Comments: Ambulatory in department without difficulty.  Cranial nerves II through XII grossly intact.  No facial droop.  No aphasia.    ED Treatments / Results  Labs (all labs ordered are listed, but only abnormal results are displayed) Labs Reviewed  CBC WITH DIFFERENTIAL/PLATELET - Abnormal; Notable for the following components:      Result Value   WBC 11.5 (*)    Platelets 130 (*)    Neutro Abs 10.5 (*)  Lymphs Abs 0.6 (*)    All other components within normal limits  COMPREHENSIVE METABOLIC PANEL - Abnormal; Notable for the following components:   Sodium 133 (*)    CO2 20 (*)    Glucose, Bld 143 (*)    Calcium 8.5 (*)    Albumin 3.3 (*)    AST 76 (*)    ALT 96 (*)    All other components within normal limits  D-DIMER, QUANTITATIVE (NOT AT [facility]) - Abnormal; Notable for the following components:   D-Dimer, Quant 0.65 (*)    All other components within normal limits  LACTATE DEHYDROGENASE - Abnormal; Notable for the following components:   LDH 323 (*)    All other components within normal limits  FERRITIN - Abnormal; Notable for the following components:   Ferritin 1,961 (*)    All other components within normal limits  FIBRINOGEN - Abnormal; Notable for the following components:   Fibrinogen >800 (*)    All other components within normal limits  C-REACTIVE PROTEIN - Abnormal; Notable for the following components:   CRP 22.4 (*)    All other components within normal limits  CULTURE, BLOOD (ROUTINE X 2)  CULTURE, BLOOD (ROUTINE X 2)  LACTIC ACID, PLASMA  PROCALCITONIN  TRIGLYCERIDES  LIPASE, BLOOD    EKG EKG Interpretation  Date/Time:  Thursday July 08 2019 15:28:42  EDT Ventricular Rate:  129 PR Interval:    QRS Duration: 90 QT Interval:  281 QTC Calculation: 409 R Axis:   61 Text Interpretation: Sinus tachycardia Atrial premature complex Probable left atrial enlargement RSR' in V1 or V2, right VCD or RVH Nonspecific repol abnormality, diffuse leads Confirmed by Rolan Bucco [phone]) on 07/08/2019 3:32:44 PM   Radiology Ct Angio Chest Pe W And/or Wo Contrast  Result Date: 07/08/2019 CLINICAL DATA:  Shortness of breath. COVID-19 positive. Abdominal pain and diarrhea EXAM: CT ANGIOGRAPHY CHEST CT ABDOMEN AND PELVIS WITH CONTRAST TECHNIQUE: Multidetector CT imaging of the chest was performed using the standard protocol during bolus administration of intravenous contrast. Multiplanar CT image reconstructions and MIPs were obtained to evaluate the vascular anatomy. Multidetector CT imaging of the abdomen and pelvis was performed using the standard protocol during bolus administration of intravenous contrast. CONTRAST:  OMNIPAQUE IOHEXOL 350 MG/ML SOLN COMPARISON:  Chest radiograph July 08, 2019; CT abdomen and pelvis November 27, 2015 FINDINGS: CTA CHEST FINDINGS Cardiovascular: There is no demonstrable pulmonary embolus. There is no thoracic aortic aneurysm or dissection. The visualized great vessels appear normal. There is no pericardial effusion or pericardial thickening. Mediastinum/Nodes: There is a nodular opacity in the right lobe of the thyroid measuring 1.8 x 1.5 cm. There is no demonstrable thoracic adenopathy by size criteria. There are occasional subcentimeter mediastinal lymph nodes. No esophageal lesions are evident. Lungs/Pleura: There is extensive airspace opacity throughout the lungs diffusely with the greatest concentration of airspace opacity in both lower lobes. The appearance of the airspace disease in the upper lobes and in the right middle lobe is consistent with ground-glass type opacity. There is a mixture ground-glass opacity and  relative consolidation in both lower lobes and in the inferior lingula. There are no appreciable pleural effusions. Musculoskeletal: No blastic or lytic bone lesions are evident. No chest wall lesions. Review of the MIP images confirms the above findings. CT ABDOMEN and PELVIS FINDINGS Hepatobiliary: No focal liver lesions are evident. Gallbladder wall is not appreciably thickened. There is no biliary duct dilatation. Pancreas: No pancreatic mass or inflammatory focus. Spleen: No  splenic lesions are evident. Adrenals/Urinary Tract: Adrenals bilaterally appear normal. There is a 7 mm cyst in the upper pole of the left kidney. There is a 6 mm cyst in the lower pole left kidney. There is no evident hydronephrosis on either side. There is no appreciable renal or ureteral calculus on either side. Urinary bladder is midline with wall thickness within normal limits. Stomach/Bowel: There is no appreciable bowel wall or mesenteric thickening. 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 Duodenum does not pass between the superior mesenteric artery and aorta. The cecum is located in the mid abdomen. No evident bowel obstruction. Terminal ileum appears unremarkable. There is no evident free air or portal venous air. Vascular/Lymphatic: No abdominal aortic aneurysm. No vascular lesions evident. No adenopathy appreciable in the abdomen or pelvis. Reproductive: There are prostatic calculi. Prostate and seminal vesicles are normal in size and contour. No evident pelvic mass. Other: No periappendiceal region inflammatory change. No abscess or ascites in the abdomen or pelvis. There is mild fat in the umbilicus. Musculoskeletal: No blastic or lytic bone lesions evident. No intramuscular or abdominal wall lesions. Review of the MIP images confirms the above findings. IMPRESSION: CT angiogram chest: 1. No demonstrable pulmonary embolus. No thoracic aortic aneurysm or dissection. 2. Multifocal airspace disease consistent with extensive  bilateral multifocal pneumonia. Mixture of consolidation and ground-glass type opacities. 3. No evident adenopathy by size criteria. 4. Nodule in the right lobe of the thyroid measuring 1.8 x 1.5 cm. Nonemergent thyroid ultrasound advised given this finding. 5. CT abdomen and pelvis: 1. 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 No bowel dilatation or bowel obstruction. No abscess in the abdomen or pelvis. No internal hernia evident. 2. No evident renal or ureteral calculus. No hydronephrosis. Urinary bladder wall thickness normal. There are prostatic calculi. Electronically Signed   By: Bretta BangWilliam  Woodruff III M.D.   On: 07/08/2019 14:22   Ct Abdomen Pelvis W Contrast  Result Date: 07/08/2019 CLINICAL DATA:  Shortness of breath. COVID-19 positive. Abdominal pain and diarrhea EXAM: CT ANGIOGRAPHY CHEST CT ABDOMEN AND PELVIS WITH CONTRAST TECHNIQUE: Multidetector CT imaging of the chest was performed using the standard protocol during bolus administration of intravenous contrast. Multiplanar CT image reconstructions and MIPs were obtained to evaluate the vascular anatomy. Multidetector CT imaging of the abdomen and pelvis was performed using the standard protocol during bolus administration of intravenous contrast. CONTRAST:  100mL OMNIPAQUE IOHEXOL 350 MG/ML SOLN COMPARISON:  Chest radiograph July 08, 2019; CT abdomen and pelvis November 27, 2015 FINDINGS: CTA CHEST FINDINGS Cardiovascular: There is no demonstrable pulmonary embolus. There is no thoracic aortic aneurysm or dissection. The visualized great vessels appear normal. There is no pericardial effusion or pericardial thickening. Mediastinum/Nodes: There is a nodular opacity in the right lobe of the thyroid measuring 1.8 x 1.5 cm. There is no demonstrable thoracic adenopathy by size criteria. There are occasional subcentimeter mediastinal lymph nodes. No esophageal lesions are evident. Lungs/Pleura: There is extensive airspace opacity throughout the lungs  diffusely with the greatest concentration of airspace opacity in both lower lobes. The appearance of the airspace disease in the upper lobes and in the right middle lobe is consistent with ground-glass type opacity. There is a mixture ground-glass opacity and relative consolidation in both lower lobes and in the inferior lingula. There are no appreciable pleural effusions. Musculoskeletal: No blastic or lytic bone lesions are evident. No chest wall lesions. Review of the MIP images confirms the above findings. CT ABDOMEN and PELVIS FINDINGS Hepatobiliary:  No focal liver lesions are evident. Gallbladder wall is not appreciably thickened. There is no biliary duct dilatation. Pancreas: No pancreatic mass or inflammatory focus. Spleen: No splenic lesions are evident. Adrenals/Urinary Tract: Adrenals bilaterally appear normal. There is a 7 mm cyst in the upper pole of the left kidney. There is a 6 mm cyst in the lower pole left kidney. There is no evident hydronephrosis on either side. There is no appreciable renal or ureteral calculus on either side. Urinary bladder is midline with wall thickness within normal limits. Stomach/Bowel: There is no appreciable bowel wall or mesenteric thickening. 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 Duodenum does not pass between the superior mesenteric artery and aorta. The cecum is located in the mid abdomen. No evident bowel obstruction. Terminal ileum appears unremarkable. There is no evident free air or portal venous air. Vascular/Lymphatic: No abdominal aortic aneurysm. No vascular lesions evident. No adenopathy appreciable in the abdomen or pelvis. Reproductive: There are prostatic calculi. Prostate and seminal vesicles are normal in size and contour. No evident pelvic mass. Other: No periappendiceal region inflammatory change. No abscess or ascites in the abdomen or pelvis. There is mild fat in the umbilicus. Musculoskeletal: No blastic or lytic bone lesions evident. No  intramuscular or abdominal wall lesions. Review of the MIP images confirms the above findings. IMPRESSION: CT angiogram chest: 1. No demonstrable pulmonary embolus. No thoracic aortic aneurysm or dissection. 2. Multifocal airspace disease consistent with extensive bilateral multifocal pneumonia. Mixture of consolidation and ground-glass type opacities. 3. No evident adenopathy by size criteria. 4. Nodule in the right lobe of the thyroid measuring 1.8 x 1.5 cm. Nonemergent thyroid ultrasound advised given this finding. 5. CT abdomen and pelvis: 1. 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 No bowel dilatation or bowel obstruction. No abscess in the abdomen or pelvis. No internal hernia evident. 2. No evident renal or ureteral calculus. No hydronephrosis. Urinary bladder wall thickness normal. There are prostatic calculi. Electronically Signed   By: Bretta Bang III M.D.   On: 07/08/2019 14:22   Dg Chest Port 1 View  Result Date: 07/08/2019 CLINICAL DATA:  Shortness of breath, COVID-19 positive EXAM: PORTABLE CHEST 1 VIEW COMPARISON:  04/13/2018 FINDINGS: Heart size is mildly enlarged. Patchy bilateral airspace opacities in a predominantly perihilar and bibasilar distribution. Lung volumes are low. No pleural effusion or pneumothorax. IMPRESSION: Patchy bilateral airspace opacities suspicious for multifocal pneumonia. Electronically Signed   By: Duanne Guess M.D.   On: 07/08/2019 11:05    Procedures .Critical Care Performed by: Linwood Dibbles, PA-C Authorized by: Linwood Dibbles, PA-C   Critical care provider statement:    Critical care time (minutes):  45   Critical care was necessary to treat or prevent imminent or life-threatening deterioration of the following conditions:  Sepsis and respiratory failure (Hypoxic respiratory failure and sepsis)   Critical care was time spent personally by me on the following activities:  Discussions with consultants, evaluation of patient's response to  treatment, examination of patient, ordering and performing treatments and interventions, ordering and review of laboratory studies, ordering and review of radiographic studies, pulse oximetry, re-evaluation of patient's condition, obtaining history from patient or surrogate and review of old charts   (including critical care time)  Medications Ordered in ED Medications  sodium chloride (PF) 0.9 % injection (0 mLs  Hold 07/08/19 1545)  remdesivir 200 mg in sodium chloride 0.9 % 250 mL IVPB (has no administration in time range)    Followed by  remdesivir 100 mg in sodium chloride 0.9 % 250 mL IVPB (has no administration in time range)  sodium chloride 0.9 % bolus 1,000 mL (0 mLs Intravenous Stopped 07/08/19 1545)  acetaminophen (TYLENOL) tablet 1,000 mg (1,000 mg Oral Given 07/08/19 1030)  iohexol (OMNIPAQUE) 350 MG/ML injection 100 mL (100 mLs Intravenous Contrast Given 07/08/19 1347)  morphine 4 MG/ML injection 4 mg (4 mg Intravenous Given 07/08/19 1444)  morphine 4 MG/ML injection 4 mg (4 mg Intravenous Given 07/08/19 1536)  sodium chloride 0.9 % bolus 1,000 mL (1,000 mLs Intravenous New Bag/Given 07/08/19 1557)   Initial Impression / Assessment and Plan / ED Course  I have reviewed the triage vital signs and the nursing notes.  Pertinent labs & imaging results that were available during my care of the patient were reviewed by me and considered in my medical decision making (see chart for details).  53 year old appears nonseptic however appears to not feel well presents for evaluation of worsening shortness of breath in setting of recent Covid diagnosis.  He is febrile, tachycardic and tachypneic.  Oxygen saturation 90% on room air however desaturates into the high 80s with any movement in bed.  History of a flutter underwent ablation in late July.  Recurrent intermittent palpitations since Covid diagnosis.  On initial evaluation patient heart rate in the 180s and A. Fib.  In room patient will  intermittently go in and out of atrial fibrillation.  Heart rate 110 in room.  Question secondary A. fib due to illness.  Covid labs ordered.  Will also give Tylenol and fluids.  Discussed with attending physician, Dr. Silverio Lay.  Do not think we need to give Cardizem for patient's atrial fibrillation currently.  His abdomen is diffusely tender. Will obtain CTA chest to rule out PE given hypercoagulable from Covid as well as CT AP for abd pain.  [place] Vasc score 0- Will hold on anticoagulation.  CTA chest negative for PE, opacities consistent with acute focal pneumonia.  Likely viral in nature given his known Covid diagnosis.  Will hold on antibiotics given known Covid diagnosis, high suspicion for viral etiology of his pneumonia. 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  No acute findings.   Patient will need admission for hypoxia associate with his Covid 19.  He is currently on 3 L via nasal cannula.  Not currently in atrial fibrillation at this time however there is some sinus tachycardia.   Labs and imaging personally reviewed and interpreted.  Discussed with Dr.Llama with [facility] who will evaluate patient for admission. VS stable.  1540: Nursing has called me into patient's room to reassess.  Patient with acute worsening abdominal pain.  On evaluation patient's abdomen is rigid and diffusely tender.  He is writhing in pain.  Given possible malrotation bowel on prior CT scan originally felt to be chronic will consult with general surgery.  1555: Consulted with Dr. Michaell Cowing from general surgery.  Feels patient is abdominal pain is likely related to his Covid diagnosis.  Do not feel patient CT scan is surgical in nature.  Do not feel needs reimaging at this time. If patient develops ileus may place NG tube at that time however otherwise feels CT AP is unremarkable. Will up date admitting team on patients status.  I have low suspicion for dissection as cause of patient's abdominal pain given his  recent imaging.  Neurosurgery has evaluated patient.  Do not feel abdomen is surgical in nature.  Intensivist recommends reconsult  Updated Dr. Shawn Stall, admitting  physician on patient status.    Patient is been seen eval by attending physician, Dr. Silverio Lay who agrees with the treatment, plan and disposition.  Recommends hold on antibiotics given known Covid status.     Final Clinical Impressions(s) / ED Diagnoses   Final diagnoses:  Sepsis with acute hypoxic respiratory failure without septic shock, due to unspecified organism (HCC)  COVID-19  Generalized abdominal pain  Non-intractable vomiting with nausea, unspecified vomiting type    ED Discharge Orders    None       Suhailah Kwan A, PA-C 07/08/19 1509    Pope Brunty A, PA-C 07/08/19 1727    Charlynne Pander, MD 07/09/19 [phone]

## 2019-07-08 NOTE — ED Triage Notes (Signed)
Pt tested Covid+ last Wed. Having fevers, SOB since this past weekend. Last took 2 Tylenol  500 mg at 8am today. Wife is nurse and she was Covid +,is where he got it from. Pt c/o abd pains and diarrhea as well.

## 2019-07-08 NOTE — ED Notes (Signed)
Surgeon and PA in room at this time.

## 2019-07-08 NOTE — ED Notes (Addendum)
Pt put on 15L NRB. SpO2 92%.

## 2019-07-09 ENCOUNTER — Encounter (HOSPITAL_COMMUNITY): Payer: Self-pay | Admitting: Internal Medicine

## 2019-07-09 ENCOUNTER — Inpatient Hospital Stay (HOSPITAL_COMMUNITY): Payer: BC Managed Care – PPO

## 2019-07-09 DIAGNOSIS — I340 Nonrheumatic mitral (valve) insufficiency: Secondary | ICD-10-CM

## 2019-07-09 DIAGNOSIS — I361 Nonrheumatic tricuspid (valve) insufficiency: Secondary | ICD-10-CM

## 2019-07-09 DIAGNOSIS — I4891 Unspecified atrial fibrillation: Secondary | ICD-10-CM

## 2019-07-09 LAB — ABO/RH: ABO/RH(D): O POS

## 2019-07-09 LAB — CBC WITH DIFFERENTIAL/PLATELET
Abs Immature Granulocytes: 0.3 10*3/uL — ABNORMAL HIGH (ref 0.00–0.07)
Basophils Absolute: 0 10*3/uL (ref 0.0–0.1)
Basophils Relative: 0 %
Eosinophils Absolute: 0 10*3/uL (ref 0.0–0.5)
Eosinophils Relative: 0 %
HCT: 40.7 % (ref 39.0–52.0)
Hemoglobin: 13.1 g/dL (ref 13.0–17.0)
Immature Granulocytes: 2 %
Lymphocytes Relative: 6 %
Lymphs Abs: 1 10*3/uL (ref 0.7–4.0)
MCH: 27.5 pg (ref 26.0–34.0)
MCHC: 32.2 g/dL (ref 30.0–36.0)
MCV: 85.3 fL (ref 80.0–100.0)
Monocytes Absolute: 0.6 10*3/uL (ref 0.1–1.0)
Monocytes Relative: 3 %
Neutro Abs: 15.2 10*3/uL — ABNORMAL HIGH (ref 1.7–7.7)
Neutrophils Relative %: 89 %
Platelets: 158 10*3/uL (ref 150–400)
RBC: 4.77 MIL/uL (ref 4.22–5.81)
RDW: 13 % (ref 11.5–15.5)
WBC: 17.1 10*3/uL — ABNORMAL HIGH (ref 4.0–10.5)
nRBC: 0 % (ref 0.0–0.2)

## 2019-07-09 LAB — COMPREHENSIVE METABOLIC PANEL
ALT: 78 U/L — ABNORMAL HIGH (ref 0–44)
AST: 58 U/L — ABNORMAL HIGH (ref 15–41)
Albumin: 3 g/dL — ABNORMAL LOW (ref 3.5–5.0)
Alkaline Phosphatase: 96 U/L (ref 38–126)
Anion gap: 8 (ref 5–15)
BUN: 13 mg/dL (ref 6–20)
CO2: 24 mmol/L (ref 22–32)
Calcium: 8.1 mg/dL — ABNORMAL LOW (ref 8.9–10.3)
Chloride: 106 mmol/L (ref 98–111)
Creatinine, Ser: 1.13 mg/dL (ref 0.61–1.24)
GFR calc Af Amer: >60 mL/min (ref >60)
GFR calc non Af Amer: >60 mL/min (ref >60)
Glucose, Bld: 151 mg/dL — ABNORMAL HIGH (ref 70–99)
Potassium: 4.5 mmol/L (ref 3.5–5.1)
Sodium: 138 mmol/L (ref 135–145)
Total Bilirubin: 0.9 mg/dL (ref 0.3–1.2)
Total Protein: 7.3 g/dL (ref 6.5–8.1)

## 2019-07-09 LAB — TROPONIN I (HIGH SENSITIVITY)
Troponin I (High Sensitivity): 21 ng/L — ABNORMAL HIGH (ref <18)
Troponin I (High Sensitivity): 13 ng/L (ref <18)

## 2019-07-09 LAB — C-REACTIVE PROTEIN: CRP: 31.5 mg/dL — ABNORMAL HIGH (ref <1.0)

## 2019-07-09 LAB — PROTIME-INR
INR: 1.2 (ref 0.8–1.2)
Prothrombin Time: 14.8 seconds (ref 11.4–15.2)

## 2019-07-09 LAB — HEMOGLOBIN A1C
Hgb A1c MFr Bld: 6.3 % — ABNORMAL HIGH (ref 4.8–5.6)
Mean Plasma Glucose: 134.11 mg/dL

## 2019-07-09 LAB — MAGNESIUM: Magnesium: 2 mg/dL (ref 1.7–2.4)

## 2019-07-09 LAB — HEPARIN LEVEL (UNFRACTIONATED)
Heparin Unfractionated: 0.45 IU/mL (ref 0.30–0.70)
Heparin Unfractionated: 0.46 IU/mL (ref 0.30–0.70)

## 2019-07-09 LAB — BRAIN NATRIURETIC PEPTIDE: B Natriuretic Peptide: 111 pg/mL — ABNORMAL HIGH (ref 0.0–100.0)

## 2019-07-09 LAB — ECHOCARDIOGRAM LIMITED
Height: 72 in
Weight: 4165.81 oz

## 2019-07-09 LAB — D-DIMER, QUANTITATIVE: D-Dimer, Quant: 0.98 ug/mL-FEU — ABNORMAL HIGH (ref 0.00–0.50)

## 2019-07-09 LAB — PHOSPHORUS: Phosphorus: 3.2 mg/dL (ref 2.5–4.6)

## 2019-07-09 LAB — HIV ANTIBODY (ROUTINE TESTING W REFLEX): HIV Screen 4th Generation wRfx: NONREACTIVE

## 2019-07-09 MED ORDER — HEPARIN (PORCINE) 25000 UT/250ML-% IV SOLN
1600.0000 [IU]/h | INTRAVENOUS | Status: DC
  Administered 2019-07-09 (×2): 1600 [IU]/h via INTRAVENOUS
  Filled 2019-07-09 (×2): qty 250

## 2019-07-09 MED ORDER — INSULIN ASPART 100 UNIT/ML ~~LOC~~ SOLN
0.0000 [IU] | Freq: Three times a day (TID) | SUBCUTANEOUS | Status: DC
  Administered 2019-07-10: 2 [IU] via SUBCUTANEOUS
  Administered 2019-07-10: 4 [IU] via SUBCUTANEOUS
  Administered 2019-07-10 – 2019-07-11 (×3): 2 [IU] via SUBCUTANEOUS
  Administered 2019-07-11: 1 [IU] via SUBCUTANEOUS
  Administered 2019-07-12: 2 [IU] via SUBCUTANEOUS
  Administered 2019-07-12: 1 [IU] via SUBCUTANEOUS
  Administered 2019-07-12: 2 [IU] via SUBCUTANEOUS
  Administered 2019-07-13: 3 [IU] via SUBCUTANEOUS
  Administered 2019-07-13 (×2): 2 [IU] via SUBCUTANEOUS
  Administered 2019-07-14: 3 [IU] via SUBCUTANEOUS
  Administered 2019-07-14: 2 [IU] via SUBCUTANEOUS
  Administered 2019-07-14: 3 [IU] via SUBCUTANEOUS
  Administered 2019-07-15: 2 [IU] via SUBCUTANEOUS
  Administered 2019-07-15: 3 [IU] via SUBCUTANEOUS
  Administered 2019-07-16: 1 [IU] via SUBCUTANEOUS

## 2019-07-09 MED ORDER — HYDROCOD POLST-CPM POLST ER 10-8 MG/5ML PO SUER
5.0000 mL | Freq: Two times a day (BID) | ORAL | Status: DC | PRN
  Administered 2019-07-09 – 2019-07-10 (×3): 5 mL via ORAL
  Filled 2019-07-09 (×4): qty 5

## 2019-07-09 MED ORDER — DILTIAZEM HCL 25 MG/5ML IV SOLN
10.0000 mg | INTRAVENOUS | Status: AC
  Administered 2019-07-09 (×2): 5 mg via INTRAVENOUS
  Filled 2019-07-09: qty 5

## 2019-07-09 MED ORDER — FENTANYL CITRATE (PF) 100 MCG/2ML IJ SOLN
50.0000 ug | INTRAMUSCULAR | Status: DC | PRN
  Administered 2019-07-09 (×3): 50 ug via INTRAVENOUS
  Filled 2019-07-09 (×3): qty 2

## 2019-07-09 MED ORDER — FENTANYL CITRATE (PF) 100 MCG/2ML IJ SOLN
50.0000 ug | Freq: Once | INTRAMUSCULAR | Status: AC
  Administered 2019-07-09: 50 ug via INTRAVENOUS

## 2019-07-09 MED ORDER — DILTIAZEM HCL 25 MG/5ML IV SOLN
10.0000 mg | Freq: Once | INTRAVENOUS | Status: AC
  Administered 2019-07-09 (×2): 5 mg via INTRAVENOUS

## 2019-07-09 MED ORDER — VITAMIN C 500 MG PO TABS
500.0000 mg | ORAL_TABLET | Freq: Every day | ORAL | Status: DC
  Administered 2019-07-09 – 2019-07-16 (×8): 500 mg via ORAL
  Filled 2019-07-09 (×7): qty 1

## 2019-07-09 MED ORDER — METOPROLOL TARTRATE 50 MG PO TABS
50.0000 mg | ORAL_TABLET | Freq: Two times a day (BID) | ORAL | Status: DC
  Administered 2019-07-09 – 2019-07-15 (×14): 50 mg via ORAL
  Filled 2019-07-09 (×14): qty 1

## 2019-07-09 MED ORDER — METHYLPREDNISOLONE SODIUM SUCC 125 MG IJ SOLR
60.0000 mg | Freq: Two times a day (BID) | INTRAMUSCULAR | Status: DC
  Administered 2019-07-09 – 2019-07-14 (×10): 60 mg via INTRAVENOUS
  Filled 2019-07-09 (×10): qty 2

## 2019-07-09 MED ORDER — DILTIAZEM HCL ER COATED BEADS 120 MG PO CP24
240.0000 mg | ORAL_CAPSULE | Freq: Every day | ORAL | Status: DC
  Administered 2019-07-10 – 2019-07-16 (×7): 240 mg via ORAL
  Filled 2019-07-09 (×7): qty 2

## 2019-07-09 MED ORDER — DILTIAZEM HCL 25 MG/5ML IV SOLN
10.0000 mg | INTRAVENOUS | Status: DC | PRN
  Administered 2019-07-09: 10 mg via INTRAVENOUS
  Filled 2019-07-09 (×3): qty 5

## 2019-07-09 MED ORDER — POTASSIUM CHLORIDE IN NACL 20-0.9 MEQ/L-% IV SOLN
INTRAVENOUS | Status: DC
  Administered 2019-07-09 (×2): via INTRAVENOUS
  Filled 2019-07-09 (×2): qty 1000

## 2019-07-09 MED ORDER — METOPROLOL TARTRATE 25 MG PO TABS
25.0000 mg | ORAL_TABLET | Freq: Once | ORAL | Status: AC
  Administered 2019-07-09: 25 mg via ORAL
  Filled 2019-07-09: qty 1

## 2019-07-09 MED ORDER — POTASSIUM CHLORIDE CRYS ER 20 MEQ PO TBCR
40.0000 meq | EXTENDED_RELEASE_TABLET | Freq: Once | ORAL | Status: AC
  Administered 2019-07-09: 05:00:00 40 meq via ORAL
  Filled 2019-07-09: qty 2

## 2019-07-09 MED ORDER — HEPARIN BOLUS VIA INFUSION
5000.0000 [IU] | Freq: Once | INTRAVENOUS | Status: AC
  Administered 2019-07-09: 5000 [IU] via INTRAVENOUS
  Filled 2019-07-09: qty 5000

## 2019-07-09 MED ORDER — ACETAMINOPHEN 325 MG PO TABS: 650.0000 mg | ORAL_TABLET | ORAL | Status: DC | PRN

## 2019-07-09 MED ORDER — GUAIFENESIN-DM 100-10 MG/5ML PO SYRP
10.0000 mL | ORAL_SOLUTION | ORAL | Status: DC | PRN
  Administered 2019-07-09: 10 mL via ORAL
  Filled 2019-07-09: qty 10

## 2019-07-09 MED ORDER — SODIUM CHLORIDE 0.9% IV SOLUTION: Freq: Once | INTRAVENOUS | Status: AC

## 2019-07-09 MED ORDER — FENTANYL CITRATE (PF) 100 MCG/2ML IJ SOLN
INTRAMUSCULAR | Status: AC
  Filled 2019-07-09: qty 2

## 2019-07-09 MED ORDER — ZINC SULFATE 220 (50 ZN) MG PO CAPS
220.0000 mg | ORAL_CAPSULE | Freq: Every day | ORAL | Status: DC
  Administered 2019-07-09 – 2019-07-16 (×8): 220 mg via ORAL
  Filled 2019-07-09 (×8): qty 1

## 2019-07-09 MED ORDER — LEVOFLOXACIN IN D5W 750 MG/150ML IV SOLN
750.0000 mg | INTRAVENOUS | Status: AC
  Administered 2019-07-09 – 2019-07-13 (×5): 750 mg via INTRAVENOUS
  Filled 2019-07-09 (×5): qty 150

## 2019-07-09 MED ORDER — MAGNESIUM SULFATE 2 GM/50ML IV SOLN
2.0000 g | Freq: Once | INTRAVENOUS | Status: AC
  Administered 2019-07-09: 2 g via INTRAVENOUS
  Filled 2019-07-09: qty 50

## 2019-07-09 MED ORDER — METOPROLOL TARTRATE 5 MG/5ML IV SOLN
5.0000 mg | Freq: Once | INTRAVENOUS | Status: AC
  Administered 2019-07-09: 5 mg via INTRAVENOUS
  Filled 2019-07-09: qty 5

## 2019-07-09 MED ORDER — DILTIAZEM HCL ER COATED BEADS 180 MG PO CP24
180.0000 mg | ORAL_CAPSULE | Freq: Every day | ORAL | Status: DC
  Administered 2019-07-09 (×2): 180 mg via ORAL
  Filled 2019-07-09 (×2): qty 1

## 2019-07-09 MED ORDER — METHYLPREDNISOLONE SODIUM SUCC 125 MG IJ SOLR
125.0000 mg | Freq: Once | INTRAMUSCULAR | Status: AC
  Administered 2019-07-09: 125 mg via INTRAVENOUS
  Filled 2019-07-09: qty 2

## 2019-07-09 MED ORDER — DILTIAZEM HCL ER 60 MG PO CP12
60.0000 mg | ORAL_CAPSULE | Freq: Once | ORAL | Status: AC
  Administered 2019-07-09: 60 mg via ORAL
  Filled 2019-07-09: qty 1

## 2019-07-09 MED ORDER — INSULIN ASPART 100 UNIT/ML ~~LOC~~ SOLN
0.0000 [IU] | Freq: Every day | SUBCUTANEOUS | Status: DC
  Administered 2019-07-13 – 2019-07-14 (×2): 2 [IU] via SUBCUTANEOUS
  Administered 2019-07-15: 3 [IU] via SUBCUTANEOUS

## 2019-07-09 NOTE — ED Notes (Signed)
Updated Ander Purpura, RN at [facility]

## 2019-07-09 NOTE — Progress Notes (Signed)
Floor coverage  Called for rapid heart rate by report a. Flutter.  Patient has h/o a. Fib s/p ablation procedure. He has not had recurrent a. Fib. He is admitted with covid pna with hypoxemia currently on 15l O2. EKG reveals sinus tach, narrow complex, at 155bpm. Per nursing report BP is stable, patient w/o chest pain. He is diaphoretic - possibly from fever and covid pna.   Plan - metoprolol 25 mg po x 1.  M. Norins MD [facility] [phone]

## 2019-07-09 NOTE — Progress Notes (Signed)
ANTICOAGULATION CONSULT NOTE - Follow Up Consult  Pharmacy Consult for IV Heparin Indication: atrial fibrillation  Allergies  Allergen Reactions  . Penicillins Rash    Has patient had a PCN reaction causing immediate rash, facial/tongue/throat swelling, SOB or lightheadedness with hypotension: unknown Has patient had a PCN reaction causing severe rash involving mucus membranes or skin necrosis: No Has patient had a PCN reaction that required hospitalization No Has patient had a PCN reaction occurring within the last 10 years: No If all of the above answers are "NO", then may proceed with Cephalosporin use.    Patient Measurements: Height: 6' (182.9 cm) Weight: 260 lb 5.8 oz (118.1 kg) IBW/kg (Calculated) : 77.6 Heparin Dosing Weight:  103 kg   Vital Signs: Temp: 98.8 F (37.1 C) (10/30 1809) Temp Source: Oral (10/30 1809) BP: 95/69 (10/30 1809) Pulse Rate: 83 (10/30 1809)  Labs: Recent Labs    07/08/19 1033 07/09/19 0353 07/09/19 0404 07/09/19 0555 07/09/19 1156 07/09/19 1757  HGB 14.8  --  13.1  --   --   --   HCT 45.0  --  40.7  --   --   --   PLT 130*  --  158  --   --   --   LABPROT  --   --  14.8  --   --   --   INR  --   --  1.2  --   --   --   HEPARINUNFRC  --   --   --   --  0.45 0.46  CREATININE 1.09  --  1.13  --   --   --   TROPONINIHS  --  21*  --  13  --   --     Estimated Creatinine Clearance: 101.5 mL/min (by C-G formula based on SCr of 1.13 mg/dL).   Medical History: Past Medical History:  Diagnosis Date  . A-fib ([place])   . Allergy   . GERD (gastroesophageal reflux disease)   . Hypertension     Medications:  Scheduled:  . aspirin EC  81 mg Oral Daily  . [START ON 07/10/2019] diltiazem  240 mg Oral Daily  . insulin aspart  0-5 Units Subcutaneous QHS  . insulin aspart  0-9 Units Subcutaneous TID WC  . methylPREDNISolone (SOLU-MEDROL) injection  60 mg Intravenous Q12H  . metoprolol tartrate  50 mg Oral BID  . pantoprazole  40 mg Oral  Daily  . vitamin C  500 mg Oral Daily  . zinc sulfate  220 mg Oral Daily    Assessment: Pharmacy is consulted to dose heparin in 53 yo male diagnosed with atrial fibrillation. Pt not on any blood thinners PTA. On aspirin 81 mg PO daily.   Patient was started on IV heparin at 1600 units/hr. Initial HL is therapeutic at 0.45. H/H and Plt wnl  Repeat heparin level came back therapeutic. We will continue with daily HL in AM.   Goal of Therapy:  Heparin level 0.3-0.7 units/ml Monitor platelets by anticoagulation protocol: Yes   Plan:   Continue IV heparin at 1600 units/hr   Daily CBC while on heparin  Daily HL  Onnie Boer, PharmD, BCIDP, AAHIVP, CPP Infectious Disease Pharmacist 07/09/2019 6:58 PM

## 2019-07-09 NOTE — Progress Notes (Signed)
  Echocardiogram 2D Echocardiogram has been performed.  Terry Delgado 07/09/2019, 8:39 AM

## 2019-07-09 NOTE — Progress Notes (Signed)
ANTICOAGULATION CONSULT NOTE - Initial Consult  Pharmacy Consult for IV Heparin Indication: atrial fibrillation  Allergies  Allergen Reactions  . Penicillins Rash    Has patient had a PCN reaction causing immediate rash, facial/tongue/throat swelling, SOB or lightheadedness with hypotension: unknown Has patient had a PCN reaction causing severe rash involving mucus membranes or skin necrosis: No Has patient had a PCN reaction that required hospitalization No Has patient had a PCN reaction occurring within the last 10 years: No If all of the above answers are "NO", then may proceed with Cephalosporin use.    Patient Measurements: Height: 6' (182.9 cm) Weight: 260 lb 5.8 oz (118.1 kg) IBW/kg (Calculated) : 77.6 Heparin Dosing Weight:  103 kg   Vital Signs: Temp: 98.4 F (36.9 C) (10/30 0315) Temp Source: Oral (10/30 0315) BP: 90/60 (10/30 0401) Pulse Rate: 88 (10/30 0401)  Labs: Recent Labs    07/08/19 1033 07/09/19 0404  HGB 14.8 13.1  HCT 45.0 40.7  PLT 130* 158  CREATININE 1.09  --     Estimated Creatinine Clearance: 105.2 mL/min (by C-G formula based on SCr of 1.09 mg/dL).   Medical History: Past Medical History:  Diagnosis Date  . A-fib ([place])   . Allergy   . GERD (gastroesophageal reflux disease)   . Hypertension     Medications:  Scheduled:  . aspirin EC  81 mg Oral Daily  . dexamethasone  6 mg Oral Q24H  . diltiazem  180 mg Oral Daily  . enoxaparin (LOVENOX) injection  60 mg Subcutaneous Q24H  . fentaNYL      . pantoprazole  40 mg Oral Daily  . potassium chloride  40 mEq Oral Once    Assessment: Pharmacy is consulted to dose heparin in 53 yo male diagnosed with atrial fibrillation. Pt not on any blood thinners PTA. On aspirin 81 mg PO daily.   Today, 07/09/19  Baseline PT/INR ordered  Hgb 13.1, plt 158  Scr 1.09 mg/dl    Goal of Therapy:  Heparin level 0.3-0.7 units/ml Monitor platelets by anticoagulation protocol: Yes   Plan:    Heparin 5000 unit IV bolus then 1600 units/hr   HL 6 hours after start of infusion  Daily CBC while on heparin  Daily HL   Monitor for signs and symptoms of bleeding  Royetta Asal, PharmD, BCPS 07/09/2019 5:04 AM

## 2019-07-09 NOTE — Progress Notes (Signed)
ANTICOAGULATION CONSULT NOTE - Follow Up Consult  Pharmacy Consult for IV Heparin Indication: atrial fibrillation  Allergies  Allergen Reactions  . Penicillins Rash    Has patient had a PCN reaction causing immediate rash, facial/tongue/throat swelling, SOB or lightheadedness with hypotension: unknown Has patient had a PCN reaction causing severe rash involving mucus membranes or skin necrosis: No Has patient had a PCN reaction that required hospitalization No Has patient had a PCN reaction occurring within the last 10 years: No If all of the above answers are "NO", then may proceed with Cephalosporin use.    Patient Measurements: Height: 6' (182.9 cm) Weight: 260 lb 5.8 oz (118.1 kg) IBW/kg (Calculated) : 77.6 Heparin Dosing Weight:  103 kg   Vital Signs: Temp: 98.2 F (36.8 C) (10/30 1231) Temp Source: Axillary (10/30 1231) BP: 108/77 (10/30 1231) Pulse Rate: 158 (10/30 1231)  Labs: Recent Labs    07/08/19 1033 07/09/19 0353 07/09/19 0404 07/09/19 0555 07/09/19 1156  HGB 14.8  --  13.1  --   --   HCT 45.0  --  40.7  --   --   PLT 130*  --  158  --   --   LABPROT  --   --  14.8  --   --   INR  --   --  1.2  --   --   HEPARINUNFRC  --   --   --   --  0.45  CREATININE 1.09  --  1.13  --   --   TROPONINIHS  --  21*  --  13  --     Estimated Creatinine Clearance: 101.5 mL/min (by C-G formula based on SCr of 1.13 mg/dL).   Medical History: Past Medical History:  Diagnosis Date  . A-fib ([place])   . Allergy   . GERD (gastroesophageal reflux disease)   . Hypertension     Medications:  Scheduled:  . aspirin EC  81 mg Oral Daily  . [START ON 07/10/2019] diltiazem  240 mg Oral Daily  . fentaNYL      . insulin aspart  0-5 Units Subcutaneous QHS  . insulin aspart  0-9 Units Subcutaneous TID WC  . methylPREDNISolone (SOLU-MEDROL) injection  60 mg Intravenous Q12H  . metoprolol tartrate  50 mg Oral BID  . pantoprazole  40 mg Oral Daily    Assessment: Pharmacy is  consulted to dose heparin in 53 yo male diagnosed with atrial fibrillation. Pt not on any blood thinners PTA. On aspirin 81 mg PO daily.   Patient was started on IV heparin at 1600 units/hr. Initial HL is therapeutic at 0.45. H/H and Plt wnl   Goal of Therapy:  Heparin level 0.3-0.7 units/ml Monitor platelets by anticoagulation protocol: Yes   Plan:   Continue IV heparin at 1600 units/hr   Recheck HL in 6 hours  Daily CBC while on heparin  Daily HL   Monitor for signs and symptoms of bleeding  Albertina Parr, PharmD., BCPS Clinical Pharmacist Clinical phone for 07/09/19 until 5pm: [phone]

## 2019-07-09 NOTE — Progress Notes (Addendum)
Patient is now irregular and between NSR and SA.  MD aware.  Will do EKG shortly unless he converts per MD.  Patient converted to NSR.  Tele shows NSR at 90.  O2 via HFNC at 10L and O2 sat 93% at present.  No acute distress present.  Denies pain.  Leana Roe, RN called and updated patient's wife on all of care since patient's arrival, answered all her questions.  See chart for assessment.  Dr. Olevia Bowens messaged about patient converting to NSR.

## 2019-07-09 NOTE — Plan of Care (Addendum)
Discussed with patient plan of care this admission, pain management and several STAT medications with the MD by the bedside with some teach back displayed.  Will inform his wife per the patient's request.  Talked to the wife via phone and updated her had no further questions at this time MD was available if needed.

## 2019-07-09 NOTE — Progress Notes (Signed)
[facility] night shift.  The patient was seen due to tachycardia in the 150s due to 2:1 atrial flutter. The patient has a history of atrial flutter/fibrillation and underwent an ablation in July this year.  He is not on anticoagulation, but was anticoagulated earlier this year. He was initially on atrial fibrillation with a HR of 186 BPM, which decreased on its own. No anticoagulation was started due to 0 score on C given metoprolol 25 mg po x1 dose earlier at [facility] prior to transfer to [place]. He stated that he felt a 6-7/10 pleuritic chest pain, but felt he was getting enough air. He denied palpitations, N/V, dizziness or palpitations. He was given metoprolol 5 mg IVPx 1 without significant response. After a total of 20 mg of IV diltiazem, the patient's HR decreased below 100 BPM.   General: Looks acutely ill. HEENT: Normocephalic. Neck: Supple, no JVD. Lungs: Tachypneic in the mid 20s. Decreased BS with no air movement on bases. CV: S1S2, regular, but tachycardic at 150 bpm. Abdomen: Obese, mildly distended. BS positive. Extremities: No E/C/C. Neuro: AAOx3, non-focal.  EKG #1. Vent. rate 186 BPM PR interval * ms QRS duration 100 ms QT/QTc 274/482 ms P-R-T axes * 29 -26 Atrial fibrillation with rapid V-rate Repolarization abnormality, prob rate related Baseline wander in lead(s) I II aVR aVF  EKG#2 Vent. rate 106 BPM PR interval * ms QRS duration 101 ms QT/QTc 329/437 ms P-R-T axes 67 57 7 Sinus tachycardia Atrial premature complexes Probable left atrial enlargement  EKG#3 Vent. rate 129 BPM PR interval * ms QRS duration 90 ms QT/QTc 281/409 ms P-R-T axes 61 61 -34 Sinus tachycardia Atrial premature complex Probable left atrial enlargement RSR' in V1 or V2, right VCD or RVH Nonspecific repol abnormality, diffuse leads  EKG#4 Vent. rate 155 BPM PR interval * ms QRS duration 213 ms QT/QTc 339/545 ms P-R-T axes 74 32 -56 Sinus tachycardia Right bundle branch  block  EKG#5 Vent. rate 156 BPM PR interval * ms QRS duration 82 ms QT/QTc 314/506 ms P-R-T axes 256 20 -61 Atrial flutter with 2:1 A-V conduction ST & T wave abnormality, consider inferior ischemia Abnormal ECG  EKG#6 Vent. rate 86 BPM PR interval * ms QRS duration 88 ms QT/QTc 334/399 ms P-R-T axes * 27 0 Atrial fibrillation  Assessment/Plan  Atrial fibrillation/Flutter Will optimize electrolytes. Check EKG as needed. Check troponin and BNP level. Start Cardizem CD 180 mg po daily. (Metoprolol did not seem to work) PRN IV diltiazem for tachycardia. CHA?DS?-VASc Score of 0 so far. Hyperglycemic and mild cardiomegaly on CxR. Will anticoagulate until echo and HbA1c are back. Discontinue heparin if above are normal.  Pleuritic chest pain Fentanyl as needed.  Tennis Must, MD  Over 50 minutes of critical care time were spent during the treatment of this emergent event.

## 2019-07-09 NOTE — Progress Notes (Signed)
PROGRESS NOTE  Terry Delgado  ZHY:[id number] DOB: 03/10/1966 DOA: 07/08/2019 PCP: Laurann Montana, MD  Brief Narrative: Terry Delgado  is a 53 y.o. male, with history of atrial fibrillation s/p ablation on 04/02/2019, not on anticoagulation who presented with fever cough and shortness of breath.  Patient says that he was diagnosed with Covid on 06/30/2019.  His wife is a Engineer, civil (consulting) who also had COVID-19.  Patient says that he has noticed worsening symptoms of Covid for past 5 days he has had worsening shortness of breath.  He also coughed up blood few days ago.  Patient says that since his diagnosis of Covid he had multiple episodes of heart palpitations.  He has been having multiple episodes of diarrhea.  Denies nausea and vomiting.  Denies headache or blurred vision.  No chest pain.  In the ED COVID-19 test was positive.  Chest x-ray showed multifocal pneumonia.  Procalcitonin 0.37, ferritin 1961, LDH 323, triglyceride 56, CRP 22.4  Patient was found to be hypoxic and requiring nonrebreather. He also complained of abdominal distention and pain.  CT scan of the abdomen showed degree of bowel malrotation no bowel dilation or bowel obstruction.  General surgery was consulted and no surgical intervention recommended at this time.  CRP increased, though hypoxia has remained stable. He's experienced intermittent rapid ventricular rate associated with atrial fibrillation/flutter which is being treated with diltiazem and metoprolol. Echocardiogram pending.  Assessment & Plan: Active Problems:   Pneumonia due to COVID-19 virus   Congenital malrotation - partial  Acute hypoxic respiratory failure due to covid-19 pneumonia: no PE on CTA chest.  - Continue remdesivir and steroids. CRP rising significantly despite this and remains on high level oxygen supplementation, NRB. After extensive discussion with patient, will give covid convalescent plasma.  - Due to possible superimposed bacterial PNA (elevated PCT), will  continue levaquin pending blood culture data (sent this AM) - Continue airborne, contact precautions. PPE including surgical gown, gloves, cap, shoe covers, and CAPR used during this encounter in a negative pressure room.  - Therapeutic anticoagulation due to AFib/AFlutter - Avoid NSAIDs - Recommend proning and aggressive use of incentive spirometry. - Strong consideration given to off-label use of tocilizumab. With his history of SBO and current abdominal distention as well as leukocytosis on admission with elevated PCT, I feel like risks are prohibitive despite his currently elevated inflammatory markers. All available literature has been reviewed including equivocal observational studies of IL-6 inhibitors and no definitively positive study of IL-6 inhibitors in prospective trials. This was discussed in detail with the patient. We will augment steroid dosing in lieu of this medication.  - Goals of care were discussed, full code confirmed. Prognosis is guarded.  Atrial fibrillation and flutter with rapid ventricular response: Has intermittently converted back to NSR. - Continue po diltiazem which had worked for him in the past prior to ablation. Will augment dose slightly as he's not hypotensive. Can continue IV pushes prn. Will also add oral metoprolol. If rate remains uncontrolled, would need cardiology consultation (would require transfer to [facility]) - Continue heparin gtt. - BNP 111, no clinical heart failure, though echo is pending. - Optimize electrolytes. Check TSH  Demand ischemia: Very mild elevation of troponin in setting of hypoxic respiratory failure and RVR, since has normalized with no angina.  - Continue management of conditions as above.   LFT elevation: Likely due to covid primarily, mild. ALT 78.  - Continue monitoring daily.   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  Pt has bowel sounds, no obstipation, but was hospitalized for SBO that did not require surgery in the past. - Monitor clinically. No peritonitis noted currently.  Prediabetes: HbA1c 6.3%. - Cover with SSI while giving steroids  Obesity: BMI 35. Noted  DVT prophylaxis: Heparin gtt Code Status: Full, confirmed Family Communication: Spent >20 minutes discussing with wife and family by phone. Disposition Plan: Uncertain  Consultants:   General surgery  Procedures:   Echocardiogram pending  Antimicrobials:  Remdesivir  Levaquin   Subjective: Feels improved from admission. No fevers this morning. No chest pain. Feels palpitations when HR is elevated, not at time of interview. Abdominal pain has completely subsided.   Objective: Vitals:   07/09/19 0445 07/09/19 0500 07/09/19 0800 07/09/19 0946  BP: 106/60 119/69 112/89 121/62  Pulse: 87 92 (!) 118   Resp: (!) 28 (!) 27  (!) 21  Temp:   98.1 F (36.7 C)   TempSrc:   Axillary   SpO2: 94% 93% 91% 96%  Weight:      Height:        Intake/Output Summary (Last 24 hours) at 07/09/2019 1157 Last data filed at 07/09/2019 7846 Gross per 24 hour  Intake 417.73 ml  Output --  Net 417.73 ml   Filed Weights   07/09/19 0300  Weight: 118.1 kg    Gen: 53 y.o. male in no distress Pulm: Non-labored tachypnea on 15L NRB. Diminished with crackles diffusely, no wheezes.  CV: Irreg irreg, rate in 90-100's. No murmur, rub, or gallop. No JVD, no pedal edema. GI: Abdomen soft, protuberant, non-tender, non-distended, with normoactive bowel sounds. No organomegaly or masses felt. Ext: Warm, no deformities Skin: No rashes, lesions or ulcers Neuro: Alert and oriented. No focal neurological deficits. Psych: Judgement and insight appear normal. Mood & affect appropriate.   Data Reviewed: I have personally reviewed following labs and imaging studies  CBC: Recent Labs  Lab 07/08/19 1033 07/09/19 0404  WBC 11.5* 17.1*  NEUTROABS 10.5*  15.2*  HGB 14.8 13.1  HCT 45.0 40.7  MCV 83.8 85.3  PLT 130* 962   Basic Metabolic Panel: Recent Labs  Lab 07/08/19 1033 07/09/19 0404  NA 133* 138  K 3.6 4.5  CL 101 106  CO2 20* 24  GLUCOSE 143* 151*  BUN 13 13  CREATININE 1.09 1.13  CALCIUM 8.5* 8.1*  MG  --  2.0  PHOS  --  3.2   GFR: Estimated Creatinine Clearance: 101.5 mL/min (by C-G formula based on SCr of 1.13 mg/dL). Liver Function Tests: Recent Labs  Lab 07/08/19 1033 07/09/19 0404  AST 76* 58*  ALT 96* 78*  ALKPHOS 97 96  BILITOT 1.1 0.9  PROT 7.6 7.3  ALBUMIN 3.3* 3.0*   Recent Labs  Lab 07/08/19 1033  LIPASE 27   No results for input(s): AMMONIA in the last 168 hours. Coagulation Profile: Recent Labs  Lab 07/09/19 0404  INR 1.2   Cardiac Enzymes: No results for input(s): CKTOTAL, CKMB, CKMBINDEX, TROPONINI in the last 168 hours. BNP (last 3 results) No results for input(s): PROBNP in the last 8760 hours. HbA1C: Recent Labs    07/09/19 0404  HGBA1C 6.3*   CBG: No results for input(s): GLUCAP in the last 168 hours. Lipid Profile: Recent Labs    07/08/19 1033  TRIG 56   Thyroid Function Tests: No results for input(s): TSH, T4TOTAL, FREET4, T3FREE, THYROIDAB in the last 72 hours. Anemia Panel:  Recent Labs    07/08/19 1033  FERRITIN 1,961*   Urine analysis:    Component Value Date/Time   COLORURINE YELLOW 11/27/2015 0859   APPEARANCEUR CLEAR 11/27/2015 0859   LABSPEC 1.021 11/27/2015 0859   PHURINE 7.0 11/27/2015 0859   GLUCOSEU NEGATIVE 11/27/2015 0859   HGBUR NEGATIVE 11/27/2015 0859   BILIRUBINUR NEGATIVE 11/27/2015 0859   KETONESUR NEGATIVE 11/27/2015 0859   PROTEINUR NEGATIVE 11/27/2015 0859   NITRITE NEGATIVE 11/27/2015 0859   LEUKOCYTESUR NEGATIVE 11/27/2015 0859   Recent Results (from the past 240 hour(s))  Novel Coronavirus, NAA ([facility])     Status: Abnormal   Collection Time: 06/30/19 12:00 AM   Specimen: Nasopharyngeal(NP) swabs in vial transport medium    NASOPHARYNGE  TESTING  Result Value Ref Range Status   SARS-CoV-2, NAA Detected (A) Not Detected Final    Comment: This nucleic acid amplification test was developed and its performance characteristics determined by [employer]. Nucleic acid amplification tests include PCR and TMA. This test has not been FDA cleared or approved. This test has been authorized by FDA under an Emergency Use Authorization (EUA). This test is only authorized for the duration of time the declaration that circumstances exist justifying the authorization of the emergency use of in vitro diagnostic tests for detection of SARS-CoV-2 virus and/or diagnosis of COVID-19 infection under section 564(b)(1) of the Act, 21 U.S.C. 161WRU-0(A) (1), unless the authorization is terminated or revoked sooner. When diagnostic testing is negative, the possibility of a false negative result should be considered in the context of a patient's recent exposures and the presence of clinical signs and symptoms consistent with COVID-19. An individual without symptoms of COVID-19 and who is not shedding SARS-CoV-2 virus would  expect to have a negative (not detected) result in this assay.   Blood Culture (routine x 2)     Status: None (Preliminary result)   Collection Time: 07/08/19 10:33 AM   Specimen: BLOOD  Result Value Ref Range Status   Specimen Description   Final    BLOOD RIGHT ARM Performed at [facility], [address], [place], [place] [address]    Special Requests   Final    BOTTLES DRAWN AEROBIC AND ANAEROBIC Blood Culture adequate volume Performed at [facility], [address] [place], [place] [address]    Culture   Final    NO GROWTH < 24 HOURS Performed at [facility], [address], [place], [place] [address]    Report Status PENDING  Incomplete      Radiology Studies: Ct Angio Chest Pe W And/or Wo Contrast  Result Date: 07/08/2019 CLINICAL DATA:   Shortness of breath. COVID-19 positive. Abdominal pain and diarrhea EXAM: CT ANGIOGRAPHY CHEST CT ABDOMEN AND PELVIS WITH CONTRAST TECHNIQUE: Multidetector CT imaging of the chest was performed using the standard protocol during bolus administration of intravenous contrast. Multiplanar CT image reconstructions and MIPs were obtained to evaluate the vascular anatomy. Multidetector CT imaging of the abdomen and pelvis was performed using the standard protocol during bolus administration of intravenous contrast. CONTRAST:  OMNIPAQUE IOHEXOL 350 MG/ML SOLN COMPARISON:  Chest radiograph July 08, 2019; CT abdomen and pelvis November 27, 2015 FINDINGS: CTA CHEST FINDINGS Cardiovascular: There is no demonstrable pulmonary embolus. There is no thoracic aortic aneurysm or dissection. The visualized great vessels appear normal. There is no pericardial effusion or pericardial thickening. Mediastinum/Nodes: There is a nodular opacity in the right lobe of the thyroid measuring 1.8 x 1.5  cm. There is no demonstrable thoracic adenopathy by size criteria. There are occasional subcentimeter mediastinal lymph nodes. No esophageal lesions are evident. Lungs/Pleura: There is extensive airspace opacity throughout the lungs diffusely with the greatest concentration of airspace opacity in both lower lobes. The appearance of the airspace disease in the upper lobes and in the right middle lobe is consistent with ground-glass type opacity. There is a mixture ground-glass opacity and relative consolidation in both lower lobes and in the inferior lingula. There are no appreciable pleural effusions. Musculoskeletal: No blastic or lytic bone lesions are evident. No chest wall lesions. Review of the MIP images confirms the above findings. CT ABDOMEN and PELVIS FINDINGS Hepatobiliary: No focal liver lesions are evident. Gallbladder wall is not appreciably thickened. There is no biliary duct dilatation. Pancreas: No pancreatic mass or  inflammatory focus. Spleen: No splenic lesions are evident. Adrenals/Urinary Tract: Adrenals bilaterally appear normal. There is a 7 mm cyst in the upper pole of the left kidney. There is a 6 mm cyst in the lower pole left kidney. There is no evident hydronephrosis on either side. There is no appreciable renal or ureteral calculus on either side. Urinary bladder is midline with wall thickness within normal limits. Stomach/Bowel: There is no appreciable bowel wall or mesenteric thickening. 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 Duodenum does not pass between the superior mesenteric artery and aorta. The cecum is located in the mid abdomen. No evident bowel obstruction. Terminal ileum appears unremarkable. There is no evident free air or portal venous air. Vascular/Lymphatic: No abdominal aortic aneurysm. No vascular lesions evident. No adenopathy appreciable in the abdomen or pelvis. Reproductive: There are prostatic calculi. Prostate and seminal vesicles are normal in size and contour. No evident pelvic mass. Other: No periappendiceal region inflammatory change. No abscess or ascites in the abdomen or pelvis. There is mild fat in the umbilicus. Musculoskeletal: No blastic or lytic bone lesions evident. No intramuscular or abdominal wall lesions. Review of the MIP images confirms the above findings. IMPRESSION: CT angiogram chest: 1. No demonstrable pulmonary embolus. No thoracic aortic aneurysm or dissection. 2. Multifocal airspace disease consistent with extensive bilateral multifocal pneumonia. Mixture of consolidation and ground-glass type opacities. 3. No evident adenopathy by size criteria. 4. Nodule in the right lobe of the thyroid measuring 1.8 x 1.5 cm. Nonemergent thyroid ultrasound advised given this finding. 5. CT abdomen and pelvis: 1. 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 No bowel dilatation or bowel obstruction. No abscess in the abdomen or pelvis. No internal hernia evident. 2. No evident renal  or ureteral calculus. No hydronephrosis. Urinary bladder wall thickness normal. There are prostatic calculi. Electronically Signed   By: Bretta BangWilliam  Woodruff III M.D.   On: 07/08/2019 14:22   Ct Abdomen Pelvis W Contrast  Result Date: 07/08/2019 CLINICAL DATA:  Shortness of breath. COVID-19 positive. Abdominal pain and diarrhea EXAM: CT ANGIOGRAPHY CHEST CT ABDOMEN AND PELVIS WITH CONTRAST TECHNIQUE: Multidetector CT imaging of the chest was performed using the standard protocol during bolus administration of intravenous contrast. Multiplanar CT image reconstructions and MIPs were obtained to evaluate the vascular anatomy. Multidetector CT imaging of the abdomen and pelvis was performed using the standard protocol during bolus administration of intravenous contrast. CONTRAST:  100mL OMNIPAQUE IOHEXOL 350 MG/ML SOLN COMPARISON:  Chest radiograph July 08, 2019; CT abdomen and pelvis November 27, 2015 FINDINGS: CTA CHEST FINDINGS Cardiovascular: There is no demonstrable pulmonary embolus. There is no thoracic aortic aneurysm or dissection. The visualized great vessels  appear normal. There is no pericardial effusion or pericardial thickening. Mediastinum/Nodes: There is a nodular opacity in the right lobe of the thyroid measuring 1.8 x 1.5 cm. There is no demonstrable thoracic adenopathy by size criteria. There are occasional subcentimeter mediastinal lymph nodes. No esophageal lesions are evident. Lungs/Pleura: There is extensive airspace opacity throughout the lungs diffusely with the greatest concentration of airspace opacity in both lower lobes. The appearance of the airspace disease in the upper lobes and in the right middle lobe is consistent with ground-glass type opacity. There is a mixture ground-glass opacity and relative consolidation in both lower lobes and in the inferior lingula. There are no appreciable pleural effusions. Musculoskeletal: No blastic or lytic bone lesions are evident. No chest wall  lesions. Review of the MIP images confirms the above findings. CT ABDOMEN and PELVIS FINDINGS Hepatobiliary: No focal liver lesions are evident. Gallbladder wall is not appreciably thickened. There is no biliary duct dilatation. Pancreas: No pancreatic mass or inflammatory focus. Spleen: No splenic lesions are evident. Adrenals/Urinary Tract: Adrenals bilaterally appear normal. There is a 7 mm cyst in the upper pole of the left kidney. There is a 6 mm cyst in the lower pole left kidney. There is no evident hydronephrosis on either side. There is no appreciable renal or ureteral calculus on either side. Urinary bladder is midline with wall thickness within normal limits. Stomach/Bowel: There is no appreciable bowel wall or mesenteric thickening. 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 Duodenum does not pass between the superior mesenteric artery and aorta. The cecum is located in the mid abdomen. No evident bowel obstruction. Terminal ileum appears unremarkable. There is no evident free air or portal venous air. Vascular/Lymphatic: No abdominal aortic aneurysm. No vascular lesions evident. No adenopathy appreciable in the abdomen or pelvis. Reproductive: There are prostatic calculi. Prostate and seminal vesicles are normal in size and contour. No evident pelvic mass. Other: No periappendiceal region inflammatory change. No abscess or ascites in the abdomen or pelvis. There is mild fat in the umbilicus. Musculoskeletal: No blastic or lytic bone lesions evident. No intramuscular or abdominal wall lesions. Review of the MIP images confirms the above findings. IMPRESSION: CT angiogram chest: 1. No demonstrable pulmonary embolus. No thoracic aortic aneurysm or dissection. 2. Multifocal airspace disease consistent with extensive bilateral multifocal pneumonia. Mixture of consolidation and ground-glass type opacities. 3. No evident adenopathy by size criteria. 4. Nodule in the right lobe of the thyroid measuring 1.8 x  1.5 cm. Nonemergent thyroid ultrasound advised given this finding. 5. CT abdomen and pelvis: 1. 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 No bowel dilatation or bowel obstruction. No abscess in the abdomen or pelvis. No internal hernia evident. 2. No evident renal or ureteral calculus. No hydronephrosis. Urinary bladder wall thickness normal. There are prostatic calculi. Electronically Signed   By: Bretta Bang III M.D.   On: 07/08/2019 14:22   Dg Chest Port 1 View  Result Date: 07/08/2019 CLINICAL DATA:  Shortness of breath, COVID-19 positive EXAM: PORTABLE CHEST 1 VIEW COMPARISON:  04/13/2018 FINDINGS: Heart size is mildly enlarged. Patchy bilateral airspace opacities in a predominantly perihilar and bibasilar distribution. Lung volumes are low. No pleural effusion or pneumothorax. IMPRESSION: Patchy bilateral airspace opacities suspicious for multifocal pneumonia. Electronically Signed   By: Duanne Guess M.D.   On: 07/08/2019 11:05    Scheduled Meds:  sodium chloride   Intravenous Once   aspirin EC  81 mg Oral Daily   dexamethasone  6 mg  Oral Q24H   diltiazem  180 mg Oral Daily   fentaNYL       pantoprazole  40 mg Oral Daily   Continuous Infusions:  sodium chloride Stopped (07/09/19 0515)   heparin 1,600 Units/hr (07/09/19 0533)   levofloxacin (LEVAQUIN) IV 750 mg (07/09/19 9604)   remdesivir 100 mg in NS 250 mL       LOS: 1 day   Time spent: 35 minutes.  Tyrone Nine, MD [facility] [email or web address] 07/09/2019, 11:57 AM

## 2019-07-10 LAB — COMPREHENSIVE METABOLIC PANEL
ALT: 64 U/L — ABNORMAL HIGH (ref 0–44)
AST: 44 U/L — ABNORMAL HIGH (ref 15–41)
Albumin: 3 g/dL — ABNORMAL LOW (ref 3.5–5.0)
Alkaline Phosphatase: 92 U/L (ref 38–126)
Anion gap: 7 (ref 5–15)
BUN: 25 mg/dL — ABNORMAL HIGH (ref 6–20)
CO2: 24 mmol/L (ref 22–32)
Calcium: 8.3 mg/dL — ABNORMAL LOW (ref 8.9–10.3)
Chloride: 106 mmol/L (ref 98–111)
Creatinine, Ser: 1.14 mg/dL (ref 0.61–1.24)
GFR calc Af Amer: >60 mL/min (ref >60)
GFR calc non Af Amer: >60 mL/min (ref >60)
Glucose, Bld: 196 mg/dL — ABNORMAL HIGH (ref 70–99)
Potassium: 4.8 mmol/L (ref 3.5–5.1)
Sodium: 137 mmol/L (ref 135–145)
Total Bilirubin: 0.4 mg/dL (ref 0.3–1.2)
Total Protein: 7.1 g/dL (ref 6.5–8.1)

## 2019-07-10 LAB — CBC WITH DIFFERENTIAL/PLATELET
Abs Immature Granulocytes: 0.22 10*3/uL — ABNORMAL HIGH (ref 0.00–0.07)
Basophils Absolute: 0 10*3/uL (ref 0.0–0.1)
Basophils Relative: 0 %
Eosinophils Absolute: 0 10*3/uL (ref 0.0–0.5)
Eosinophils Relative: 0 %
HCT: 38.8 % — ABNORMAL LOW (ref 39.0–52.0)
Hemoglobin: 12.2 g/dL — ABNORMAL LOW (ref 13.0–17.0)
Immature Granulocytes: 2 %
Lymphocytes Relative: 10 %
Lymphs Abs: 1.5 10*3/uL (ref 0.7–4.0)
MCH: 27.1 pg (ref 26.0–34.0)
MCHC: 31.4 g/dL (ref 30.0–36.0)
MCV: 86.2 fL (ref 80.0–100.0)
Monocytes Absolute: 0.5 10*3/uL (ref 0.1–1.0)
Monocytes Relative: 3 %
Neutro Abs: 12.3 10*3/uL — ABNORMAL HIGH (ref 1.7–7.7)
Neutrophils Relative %: 85 %
Platelets: 193 10*3/uL (ref 150–400)
RBC: 4.5 MIL/uL (ref 4.22–5.81)
RDW: 13.3 % (ref 11.5–15.5)
WBC: 14.5 10*3/uL — ABNORMAL HIGH (ref 4.0–10.5)
nRBC: 0 % (ref 0.0–0.2)

## 2019-07-10 LAB — HEPARIN LEVEL (UNFRACTIONATED): Heparin Unfractionated: 0.75 IU/mL — ABNORMAL HIGH (ref 0.30–0.70)

## 2019-07-10 LAB — D-DIMER, QUANTITATIVE: D-Dimer, Quant: 0.83 ug/mL-FEU — ABNORMAL HIGH (ref 0.00–0.50)

## 2019-07-10 LAB — GLUCOSE, CAPILLARY
Glucose-Capillary: 174 mg/dL — ABNORMAL HIGH (ref 70–99)
Glucose-Capillary: 158 mg/dL — ABNORMAL HIGH (ref 70–99)
Glucose-Capillary: 146 mg/dL — ABNORMAL HIGH (ref 70–99)

## 2019-07-10 LAB — C-REACTIVE PROTEIN: CRP: 21.9 mg/dL — ABNORMAL HIGH (ref <1.0)

## 2019-07-10 LAB — TSH: TSH: 1.512 u[IU]/mL (ref 0.350–4.500)

## 2019-07-10 MED ORDER — ENOXAPARIN SODIUM 120 MG/0.8ML ~~LOC~~ SOLN
1.0000 mg/kg | Freq: Two times a day (BID) | SUBCUTANEOUS | Status: DC
  Administered 2019-07-10 – 2019-07-16 (×13): 120 mg via SUBCUTANEOUS
  Filled 2019-07-10 (×15): qty 0.8

## 2019-07-10 MED ORDER — HEPARIN (PORCINE) 25000 UT/250ML-% IV SOLN
1450.0000 [IU]/h | INTRAVENOUS | Status: DC
  Filled 2019-07-10: qty 250

## 2019-07-10 NOTE — Progress Notes (Addendum)
ANTICOAGULATION CONSULT NOTE - Follow Up Consult  Pharmacy Consult for IV Heparin > Lovenox  Indication: atrial fibrillation  Allergies  Allergen Reactions  . Penicillins Rash    Has patient had a PCN reaction causing immediate rash, facial/tongue/throat swelling, SOB or lightheadedness with hypotension: unknown Has patient had a PCN reaction causing severe rash involving mucus membranes or skin necrosis: No Has patient had a PCN reaction that required hospitalization No Has patient had a PCN reaction occurring within the last 10 years: No If all of the above answers are "NO", then may proceed with Cephalosporin use.    Patient Measurements: Height: 6' (182.9 cm) Weight: 260 lb 5.8 oz (118.1 kg) IBW/kg (Calculated) : 77.6 Heparin Dosing Weight:  103 kg   Vital Signs: Temp: 97.9 F (36.6 C) (10/31 1108) Temp Source: Axillary (10/31 1108) BP: 111/78 (10/31 1108) Pulse Rate: 73 (10/31 1108)  Labs: Recent Labs    07/08/19 1033 07/09/19 0353 07/09/19 0404 07/09/19 0555 07/09/19 1156 07/09/19 1757 07/10/19 0257  HGB 14.8  --  13.1  --   --   --  12.2*  HCT 45.0  --  40.7  --   --   --  38.8*  PLT 130*  --  158  --   --   --  193  LABPROT  --   --  14.8  --   --   --   --   INR  --   --  1.2  --   --   --   --   HEPARINUNFRC  --   --   --   --  0.45 0.46 0.75*  CREATININE 1.09  --  1.13  --   --   --  1.14  TROPONINIHS  --  21*  --  13  --   --   --     Estimated Creatinine Clearance: 100.6 mL/min (by C-G formula based on SCr of 1.14 mg/dL).   Medical History: Past Medical History:  Diagnosis Date  . A-fib ([place])   . Allergy   . GERD (gastroesophageal reflux disease)   . Hypertension     Medications:  Scheduled:  . aspirin EC  81 mg Oral Daily  . diltiazem  240 mg Oral Daily  . insulin aspart  0-5 Units Subcutaneous QHS  . insulin aspart  0-9 Units Subcutaneous TID WC  . methylPREDNISolone (SOLU-MEDROL) injection  60 mg Intravenous Q12H  . metoprolol  tartrate  50 mg Oral BID  . pantoprazole  40 mg Oral Daily  . vitamin C  500 mg Oral Daily  . zinc sulfate  220 mg Oral Daily    Assessment: Pharmacy is consulted to dose heparin in 53 yo male diagnosed with atrial fibrillation. Pt not on any blood thinners PTA. On aspirin 81 mg PO daily.   Currently on IV heparin but transitioning to treatment dose Lovenox today. H/H slowly trending down, Plt wnl. SCr wnl   Goal of Therapy:  Anti-Xa level 0.6-1 units/ml 4hrs after LMWH dose given Monitor platelets by anticoagulation protocol: Yes   Plan:  -Stop IV heparin -Start Lovenox 120 mg twice daily  -Monitor CBC, renal fx, and s/s of bleeding    Albertina Parr, PharmD., BCPS Clinical Pharmacist Clinical phone for 07/10/19 until 5pm: [phone]

## 2019-07-10 NOTE — Progress Notes (Signed)
ANTICOAGULATION CONSULT NOTE - Follow Up Consult  Pharmacy Consult for IV Heparin Indication: atrial fibrillation  Allergies  Allergen Reactions  . Penicillins Rash    Has patient had a PCN reaction causing immediate rash, facial/tongue/throat swelling, SOB or lightheadedness with hypotension: unknown Has patient had a PCN reaction causing severe rash involving mucus membranes or skin necrosis: No Has patient had a PCN reaction that required hospitalization No Has patient had a PCN reaction occurring within the last 10 years: No If all of the above answers are "NO", then may proceed with Cephalosporin use.    Patient Measurements: Height: 6' (182.9 cm) Weight: 260 lb 5.8 oz (118.1 kg) IBW/kg (Calculated) : 77.6 Heparin Dosing Weight:  103 kg   Vital Signs: Temp: 99.3 F (37.4 C) (10/31 0430) Temp Source: Oral (10/31 0430) BP: 112/75 (10/31 0430) Pulse Rate: 67 (10/31 0443)  Labs: Recent Labs    07/08/19 1033 07/09/19 0353 07/09/19 0404 07/09/19 0555 07/09/19 1156 07/09/19 1757 07/10/19 0257  HGB 14.8  --  13.1  --   --   --  12.2*  HCT 45.0  --  40.7  --   --   --  38.8*  PLT 130*  --  158  --   --   --  193  LABPROT  --   --  14.8  --   --   --   --   INR  --   --  1.2  --   --   --   --   HEPARINUNFRC  --   --   --   --  0.45 0.46 0.75*  CREATININE 1.09  --  1.13  --   --   --  1.14  TROPONINIHS  --  21*  --  13  --   --   --     Estimated Creatinine Clearance: 100.6 mL/min (by C-G formula based on SCr of 1.14 mg/dL).   Medical History: Past Medical History:  Diagnosis Date  . A-fib ([place])   . Allergy   . GERD (gastroesophageal reflux disease)   . Hypertension     Medications:  Scheduled:  . aspirin EC  81 mg Oral Daily  . diltiazem  240 mg Oral Daily  . insulin aspart  0-5 Units Subcutaneous QHS  . insulin aspart  0-9 Units Subcutaneous TID WC  . methylPREDNISolone (SOLU-MEDROL) injection  60 mg Intravenous Q12H  . metoprolol tartrate  50 mg Oral  BID  . pantoprazole  40 mg Oral Daily  . vitamin C  500 mg Oral Daily  . zinc sulfate  220 mg Oral Daily    Assessment: Pharmacy is consulted to dose heparin in 53 yo male diagnosed with atrial fibrillation. Pt not on any blood thinners PTA. On aspirin 81 mg PO daily.   Patient was started on IV heparin at 1600 units/hr. Initial HL is therapeutic at 0.45. H/H and Plt wnl  AM heparin level is supratherapeutic at 0.75. No line or bleeding issues per RN Hgb, plt Scr stable  Goal of Therapy:  Heparin level 0.3-0.7 units/ml Monitor platelets by anticoagulation protocol: Yes   Plan:   Decrease IV heparin to 1450 units/hr   Obtain HL 6 hours after rate change   Daily CBC while on heparin  Daily HL   Royetta Asal, PharmD, BCPS 07/10/2019 6:18 AM

## 2019-07-10 NOTE — Progress Notes (Signed)
PROGRESS NOTE  Terry Delgado  WUJ:[id number] DOB: Feb 07, 1966 DOA: 07/08/2019 PCP: Laurann Montana, MD  Brief Narrative: Terry Delgado  is a 53 y.o. male, with history of atrial fibrillation s/p ablation on 04/02/2019, not on anticoagulation who presented with fever cough and shortness of breath.  Patient says that he was diagnosed with Covid on 06/30/2019.  His wife is a Engineer, civil (consulting) who also had COVID-19.  Patient says that he has noticed worsening symptoms of Covid for past 5 days he has had worsening shortness of breath.  He also coughed up blood few days ago.  Patient says that since his diagnosis of Covid he had multiple episodes of heart palpitations.  He has been having multiple episodes of diarrhea.  Denies nausea and vomiting.  Denies headache or blurred vision.  No chest pain.  In the ED COVID-19 test was positive.  Chest x-ray showed multifocal pneumonia.  Procalcitonin 0.37, ferritin 1961, LDH 323, triglyceride 56, CRP 22.4  Patient was found to be hypoxic and requiring nonrebreather. He also complained of abdominal distention and pain.  CT scan of the abdomen showed degree of bowel malrotation no bowel dilation or bowel obstruction.  General surgery was consulted and no surgical intervention recommended at this time.  CRP increased, though hypoxia has remained stable. He's experienced intermittent rapid ventricular rate associated with atrial fibrillation/flutter which is being treated with diltiazem and metoprolol. Convalescent plasma given 10/30.  Assessment & Plan: Active Problems:   Pneumonia due to COVID-19 virus   Congenital malrotation - partial  Acute hypoxic respiratory failure due to covid-19 pneumonia: no PE on CTA chest.  - Continue remdesivir 10/29 - 11/2 - Continue steroids, dose augmented 10/30 - Gave CCP 10/30.  - Repeat CRP pending.  - Due to possible superimposed bacterial PNA (elevated PCT and WBC on admission), will continue levaquin pending blood culture data (NG1D) -  Continue airborne, contact precautions. PPE including surgical gown, gloves, cap, shoe covers, and CAPR used during this encounter in a negative pressure room.  - Therapeutic anticoagulation due to AFib/AFlutter - Avoid NSAIDs - Recommend proning and aggressive use of incentive spirometry. - Goals of care were discussed, full code confirmed. Prognosis is guarded.  Atrial fibrillation and flutter with rapid ventricular response: Has intermittently converted back to NSR. Normal biatrial size on echo.  - Continue po diltiazem  qd and metoprolol  BID. BP normotensive, HR control improved, has converted to NSR at this time. Continue IV prn's.  - Continue anticoagulation, can transition to lovenox - BNP 111, no clinical heart failure, normal RV systolic function and LVEF 60-65% without mention of diastolic function, normal RA pressure estimate.  - Optimize electrolytes. TSH pending.  Demand ischemia: Very mild elevation of troponin in setting of hypoxic respiratory failure and RVR, since has normalized with no angina. No local wall motion abnormalities on echo. - Continue management of conditions as above.   LFT elevation: Likely due to covid primarily, mild and improving.   - Continue monitoring daily.   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 Pt has bowel sounds, no obstipation, but was hospitalized for SBO that did not require surgery in the past. - Monitor clinically. No peritonitis noted currently.  Prediabetes: HbA1c 6.3%. - Some hyperglycemia in setting of steroids, so will cover with SSI.  Obesity: BMI 35. Noted  DVT prophylaxis: Heparin gtt > lovenox Code Status: Full, confirmed Family Communication: Speaking with family daily Disposition  Plan: Uncertain  Consultants:   General surgery  Procedures:   Echocardiogram Limited 10/30: IMPRESSIONS  1. Left  ventricular ejection fraction, by visual estimation, is 65 to 70%. The left ventricle has hyperdynamic function. Normal left ventricular size. There is mildly increased left ventricular hypertrophy.  2. Left ventricular diastolic function could not be evaluated.  3. Global right ventricle has normal systolic function.The right ventricular size is normal. No increase in right ventricular wall thickness.  4. Left atrial size was normal.  5. Right atrial size was normal.  6. The mitral valve is normal in structure. Mild mitral valve regurgitation. No evidence of mitral stenosis.  7. The tricuspid valve is normal in structure. Tricuspid valve regurgitation is mild.  8. The aortic valve is normal in structure. Aortic valve regurgitation is not visualized. No evidence of aortic valve sclerosis or stenosis.  9. The pulmonic valve was normal in structure. Pulmonic valve regurgitation is not visualized. 10. The inferior vena cava is normal in size with greater than 50% respiratory variability, suggesting right atrial pressure of 3 mmHg.  Antimicrobials:  Remdesivir 10/29 - 11/2  Levaquin 10/29 - 11/2  Subjective: Continues to feel slightly better, slept overnight for the first time in many days. Eating ok. No abd pain reported. No chest pain. +cough is severe.  Objective: Vitals:   07/10/19 0430 07/10/19 0443 07/10/19 0711 07/10/19 1108  BP: 112/75  116/77 111/78  Pulse: 65 67 75 73  Resp: (!) 28 20 20 18   Temp: 99.3 F (37.4 C)  97.9 F (36.6 C) 97.9 F (36.6 C)  TempSrc: Oral  Axillary Axillary  SpO2: 91% 93% 92% 94%  Weight:      Height:        Intake/Output Summary (Last 24 hours) at 07/10/2019 1121 Last data filed at 07/10/2019 0800 Gross per 24 hour  Intake 1805.34 ml  Output 800 ml  Net 1005.34 ml   Filed Weights   07/09/19 0300  Weight: 118.1 kg   Gen: 53 y.o. male in no distress, sleeping when I enter room Pulm: Nonlabored 15L NRB, rate 25 when sleeping, 35/min when  awake and speaking only in short sentences but no accessory muscle use. Crackles bilaterally. CV: Regular rate and rhythm. No murmur, rub, or gallop. No JVD, no dependent edema. GI: Abdomen soft, non-tender, not taut, slightly distended, with normoactive bowel sounds.  Ext: Warm, no deformities Skin: No rashes, lesions or ulcers on visualized skin. Neuro: Alert and oriented. No focal neurological deficits. Psych: Judgement and insight appear fair. Mood euthymic & affect congruent. Behavior is appropriate.    Data Reviewed: I have personally reviewed following labs and imaging studies  CBC: Recent Labs  Lab 07/08/19 1033 07/09/19 0404 07/10/19 0257  WBC 11.5* 17.1* 14.5*  NEUTROABS 10.5* 15.2* 12.3*  HGB 14.8 13.1 12.2*  HCT 45.0 40.7 38.8*  MCV 83.8 85.3 86.2  PLT 130* 158 962   Basic Metabolic Panel: Recent Labs  Lab 07/08/19 1033 07/09/19 0404 07/10/19 0257  NA 133* 138 137  K 3.6 4.5 4.8  CL 101 106 106  CO2 20* 24 24  GLUCOSE 143* 151* 196*  BUN 13 13 25*  CREATININE 1.09 1.13 1.14  CALCIUM 8.5* 8.1* 8.3*  MG  --  2.0  --   PHOS  --  3.2  --    GFR: Estimated Creatinine Clearance: 100.6 mL/min (by C-G formula based on SCr of 1.14 mg/dL). Liver Function Tests: Recent Labs  Lab 07/08/19 1033 07/09/19 0404 07/10/19  0257  AST 76* 58* 44*  ALT 96* 78* 64*  ALKPHOS 97 96 92  BILITOT 1.1 0.9 0.4  PROT 7.6 7.3 7.1  ALBUMIN 3.3* 3.0* 3.0*   Recent Labs  Lab 07/08/19 1033  LIPASE 27   No results for input(s): AMMONIA in the last 168 hours. Coagulation Profile: Recent Labs  Lab 07/09/19 0404  INR 1.2   Cardiac Enzymes: No results for input(s): CKTOTAL, CKMB, CKMBINDEX, TROPONINI in the last 168 hours. BNP (last 3 results) No results for input(s): PROBNP in the last 8760 hours. HbA1C: Recent Labs    07/09/19 0404  HGBA1C 6.3*   CBG: Recent Labs  Lab 07/10/19 0713 07/10/19 1106  GLUCAP 174* 158*   Lipid Profile: Recent Labs     07/08/19 1033  TRIG 56   Thyroid Function Tests: No results for input(s): TSH, T4TOTAL, FREET4, T3FREE, THYROIDAB in the last 72 hours. Anemia Panel: Recent Labs    07/08/19 1033  FERRITIN 1,961*   Urine analysis:    Component Value Date/Time   COLORURINE YELLOW 11/27/2015 0859   APPEARANCEUR CLEAR 11/27/2015 0859   LABSPEC 1.021 11/27/2015 0859   PHURINE 7.0 11/27/2015 0859   GLUCOSEU NEGATIVE 11/27/2015 0859   HGBUR NEGATIVE 11/27/2015 0859   BILIRUBINUR NEGATIVE 11/27/2015 0859   KETONESUR NEGATIVE 11/27/2015 0859   PROTEINUR NEGATIVE 11/27/2015 0859   NITRITE NEGATIVE 11/27/2015 0859   LEUKOCYTESUR NEGATIVE 11/27/2015 0859   Recent Results (from the past 240 hour(s))  Blood Culture (routine x 2)     Status: None (Preliminary result)   Collection Time: 07/08/19 10:33 AM   Specimen: BLOOD  Result Value Ref Range Status   Specimen Description   Final    BLOOD RIGHT ARM Performed at [facility], [address], [place], [place] [address]    Special Requests   Final    BOTTLES DRAWN AEROBIC AND ANAEROBIC Blood Culture adequate volume Performed at [facility], [address], [place], [place] [address]    Culture   Final    NO GROWTH 2 DAYS Performed at [facility], [address], [place], [place] [address]    Report Status PENDING  Incomplete  Blood Culture (routine x 2)     Status: None (Preliminary result)   Collection Time: 07/09/19  4:04 AM   Specimen: BLOOD  Result Value Ref Range Status   Specimen Description   Final    BLOOD RIGHT AC Performed at [facility], [address], [place], [place] [address]    Special Requests   Final    BOTTLES DRAWN AEROBIC AND ANAEROBIC Blood Culture adequate volume Performed at [facility], [address] [place], [place] [address]    Culture   Final    NO GROWTH 1 DAY Performed at [facility], [address],  [place], [place] [address]    Report Status PENDING  Incomplete      Radiology Studies: Ct Angio Chest Pe W And/or Wo Contrast  Result Date: 07/08/2019 CLINICAL DATA:  Shortness of breath. COVID-19 positive. Abdominal pain and diarrhea EXAM: CT ANGIOGRAPHY CHEST CT ABDOMEN AND PELVIS WITH CONTRAST TECHNIQUE: Multidetector CT imaging of the chest was performed using the standard protocol during bolus administration of intravenous contrast. Multiplanar CT image reconstructions and MIPs were obtained to evaluate the vascular anatomy. Multidetector CT imaging of the abdomen and pelvis was performed using the standard protocol during bolus administration of intravenous contrast. CONTRAST:  OMNIPAQUE IOHEXOL 350 MG/ML SOLN COMPARISON:  Chest radiograph July 08, 2019; CT abdomen and pelvis November 27, 2015 FINDINGS: CTA CHEST FINDINGS Cardiovascular: There is no demonstrable pulmonary embolus. There is no thoracic aortic aneurysm or dissection. The visualized great vessels appear normal. There is no pericardial effusion or pericardial thickening. Mediastinum/Nodes: There is a nodular opacity in the right lobe of the thyroid measuring 1.8 x 1.5 cm. There is no demonstrable thoracic adenopathy by size criteria. There are occasional subcentimeter mediastinal lymph nodes. No esophageal lesions are evident. Lungs/Pleura: There is extensive airspace opacity throughout the lungs diffusely with the greatest concentration of airspace opacity in both lower lobes. The appearance of the airspace disease in the upper lobes and in the right middle lobe is consistent with ground-glass type opacity. There is a mixture ground-glass opacity and relative consolidation in both lower lobes and in the inferior lingula. There are no appreciable pleural effusions. Musculoskeletal: No blastic or lytic bone lesions are evident. No chest wall lesions. Review of the MIP images confirms the above findings. CT ABDOMEN and PELVIS FINDINGS  Hepatobiliary: No focal liver lesions are evident. Gallbladder wall is not appreciably thickened. There is no biliary duct dilatation. Pancreas: No pancreatic mass or inflammatory focus. Spleen: No splenic lesions are evident. Adrenals/Urinary Tract: Adrenals bilaterally appear normal. There is a 7 mm cyst in the upper pole of the left kidney. There is a 6 mm cyst in the lower pole left kidney. There is no evident hydronephrosis on either side. There is no appreciable renal or ureteral calculus on either side. Urinary bladder is midline with wall thickness within normal limits. Stomach/Bowel: There is no appreciable bowel wall or mesenteric thickening. 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 Duodenum does not pass between the superior mesenteric artery and aorta. The cecum is located in the mid abdomen. No evident bowel obstruction. Terminal ileum appears unremarkable. There is no evident free air or portal venous air. Vascular/Lymphatic: No abdominal aortic aneurysm. No vascular lesions evident. No adenopathy appreciable in the abdomen or pelvis. Reproductive: There are prostatic calculi. Prostate and seminal vesicles are normal in size and contour. No evident pelvic mass. Other: No periappendiceal region inflammatory change. No abscess or ascites in the abdomen or pelvis. There is mild fat in the umbilicus. Musculoskeletal: No blastic or lytic bone lesions evident. No intramuscular or abdominal wall lesions. Review of the MIP images confirms the above findings. IMPRESSION: CT angiogram chest: 1. No demonstrable pulmonary embolus. No thoracic aortic aneurysm or dissection. 2. Multifocal airspace disease consistent with extensive bilateral multifocal pneumonia. Mixture of consolidation and ground-glass type opacities. 3. No evident adenopathy by size criteria. 4. Nodule in the right lobe of the thyroid measuring 1.8 x 1.5 cm. Nonemergent thyroid ultrasound advised given this finding. 5. CT abdomen and pelvis: 1.  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 No bowel dilatation or bowel obstruction. No abscess in the abdomen or pelvis. No internal hernia evident. 2. No evident renal or ureteral calculus. No hydronephrosis. Urinary bladder wall thickness normal. There are prostatic calculi. Electronically Signed   By: Bretta Bang III M.D.   On: 07/08/2019 14:22   Ct Abdomen Pelvis W Contrast  Result Date: 07/08/2019 CLINICAL DATA:  Shortness of breath. COVID-19 positive. Abdominal pain and diarrhea EXAM: CT ANGIOGRAPHY CHEST CT ABDOMEN AND PELVIS WITH CONTRAST TECHNIQUE: Multidetector CT imaging of the chest was performed using the standard protocol during bolus administration of intravenous contrast. Multiplanar CT image reconstructions and MIPs were obtained  to evaluate the vascular anatomy. Multidetector CT imaging of the abdomen and pelvis was performed using the standard protocol during bolus administration of intravenous contrast. CONTRAST:  100mL OMNIPAQUE IOHEXOL 350 MG/ML SOLN COMPARISON:  Chest radiograph July 08, 2019; CT abdomen and pelvis November 27, 2015 FINDINGS: CTA CHEST FINDINGS Cardiovascular: There is no demonstrable pulmonary embolus. There is no thoracic aortic aneurysm or dissection. The visualized great vessels appear normal. There is no pericardial effusion or pericardial thickening. Mediastinum/Nodes: There is a nodular opacity in the right lobe of the thyroid measuring 1.8 x 1.5 cm. There is no demonstrable thoracic adenopathy by size criteria. There are occasional subcentimeter mediastinal lymph nodes. No esophageal lesions are evident. Lungs/Pleura: There is extensive airspace opacity throughout the lungs diffusely with the greatest concentration of airspace opacity in both lower lobes. The appearance of the airspace disease in the upper lobes and in the right middle lobe is consistent with ground-glass type opacity. There is a mixture ground-glass opacity and relative consolidation in both  lower lobes and in the inferior lingula. There are no appreciable pleural effusions. Musculoskeletal: No blastic or lytic bone lesions are evident. No chest wall lesions. Review of the MIP images confirms the above findings. CT ABDOMEN and PELVIS FINDINGS Hepatobiliary: No focal liver lesions are evident. Gallbladder wall is not appreciably thickened. There is no biliary duct dilatation. Pancreas: No pancreatic mass or inflammatory focus. Spleen: No splenic lesions are evident. Adrenals/Urinary Tract: Adrenals bilaterally appear normal. There is a 7 mm cyst in the upper pole of the left kidney. There is a 6 mm cyst in the lower pole left kidney. There is no evident hydronephrosis on either side. There is no appreciable renal or ureteral calculus on either side. Urinary bladder is midline with wall thickness within normal limits. Stomach/Bowel: There is no appreciable bowel wall or mesenteric thickening. 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 Duodenum does not pass between the superior mesenteric artery and aorta. The cecum is located in the mid abdomen. No evident bowel obstruction. Terminal ileum appears unremarkable. There is no evident free air or portal venous air. Vascular/Lymphatic: No abdominal aortic aneurysm. No vascular lesions evident. No adenopathy appreciable in the abdomen or pelvis. Reproductive: There are prostatic calculi. Prostate and seminal vesicles are normal in size and contour. No evident pelvic mass. Other: No periappendiceal region inflammatory change. No abscess or ascites in the abdomen or pelvis. There is mild fat in the umbilicus. Musculoskeletal: No blastic or lytic bone lesions evident. No intramuscular or abdominal wall lesions. Review of the MIP images confirms the above findings. IMPRESSION: CT angiogram chest: 1. No demonstrable pulmonary embolus. No thoracic aortic aneurysm or dissection. 2. Multifocal airspace disease consistent with extensive bilateral multifocal pneumonia.  Mixture of consolidation and ground-glass type opacities. 3. No evident adenopathy by size criteria. 4. Nodule in the right lobe of the thyroid measuring 1.8 x 1.5 cm. Nonemergent thyroid ultrasound advised given this finding. 5. CT abdomen and pelvis: 1. 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 No bowel dilatation or bowel obstruction. No abscess in the abdomen or pelvis. No internal hernia evident. 2. No evident renal or ureteral calculus. No hydronephrosis. Urinary bladder wall thickness normal. There are prostatic calculi. Electronically Signed   By: Bretta BangWilliam  Woodruff III M.D.   On: 07/08/2019 14:22    Scheduled Meds:  aspirin EC  81 mg Oral Daily   diltiazem  240 mg Oral Daily   insulin aspart  0-5 Units Subcutaneous QHS   insulin  aspart  0-9 Units Subcutaneous TID WC   methylPREDNISolone (SOLU-MEDROL) injection  60 mg Intravenous Q12H   metoprolol tartrate  50 mg Oral BID   pantoprazole  40 mg Oral Daily   vitamin C  500 mg Oral Daily   zinc sulfate  220 mg Oral Daily   Continuous Infusions:  sodium chloride Stopped (07/09/19 0515)   heparin 1,450 Units/hr (07/10/19 4854)   levofloxacin (LEVAQUIN) IV 750 mg (07/10/19 0622)   remdesivir 100 mg in NS 250 mL 100 mg (07/09/19 1613)     LOS: 2 days   Time spent: 35 minutes.  Tyrone Nine, MD [facility] [email or web address] 07/10/2019, 11:21 AM

## 2019-07-10 NOTE — Progress Notes (Signed)
RN called pt/'s wife and patient facetime his wife. No further concerns

## 2019-07-11 LAB — CBC WITH DIFFERENTIAL/PLATELET
Abs Immature Granulocytes: 0.25 10*3/uL — ABNORMAL HIGH (ref 0.00–0.07)
Basophils Absolute: 0.1 10*3/uL (ref 0.0–0.1)
Basophils Relative: 0 %
Eosinophils Absolute: 0 10*3/uL (ref 0.0–0.5)
Eosinophils Relative: 0 %
HCT: 38.9 % — ABNORMAL LOW (ref 39.0–52.0)
Hemoglobin: 12.4 g/dL — ABNORMAL LOW (ref 13.0–17.0)
Immature Granulocytes: 2 %
Lymphocytes Relative: 8 %
Lymphs Abs: 1.2 10*3/uL (ref 0.7–4.0)
MCH: 27.3 pg (ref 26.0–34.0)
MCHC: 31.9 g/dL (ref 30.0–36.0)
MCV: 85.7 fL (ref 80.0–100.0)
Monocytes Absolute: 0.7 10*3/uL (ref 0.1–1.0)
Monocytes Relative: 5 %
Neutro Abs: 11.9 10*3/uL — ABNORMAL HIGH (ref 1.7–7.7)
Neutrophils Relative %: 85 %
Platelets: 243 10*3/uL (ref 150–400)
RBC: 4.54 MIL/uL (ref 4.22–5.81)
RDW: 13.4 % (ref 11.5–15.5)
WBC: 14.1 10*3/uL — ABNORMAL HIGH (ref 4.0–10.5)
nRBC: 0 % (ref 0.0–0.2)

## 2019-07-11 LAB — COMPREHENSIVE METABOLIC PANEL
ALT: 59 U/L — ABNORMAL HIGH (ref 0–44)
AST: 35 U/L (ref 15–41)
Albumin: 2.7 g/dL — ABNORMAL LOW (ref 3.5–5.0)
Alkaline Phosphatase: 80 U/L (ref 38–126)
Anion gap: 8 (ref 5–15)
BUN: 28 mg/dL — ABNORMAL HIGH (ref 6–20)
CO2: 23 mmol/L (ref 22–32)
Calcium: 8.4 mg/dL — ABNORMAL LOW (ref 8.9–10.3)
Chloride: 109 mmol/L (ref 98–111)
Creatinine, Ser: 1.03 mg/dL (ref 0.61–1.24)
GFR calc Af Amer: >60 mL/min (ref >60)
GFR calc non Af Amer: >60 mL/min (ref >60)
Glucose, Bld: 171 mg/dL — ABNORMAL HIGH (ref 70–99)
Potassium: 4.6 mmol/L (ref 3.5–5.1)
Sodium: 140 mmol/L (ref 135–145)
Total Bilirubin: 0.4 mg/dL (ref 0.3–1.2)
Total Protein: 6.7 g/dL (ref 6.5–8.1)

## 2019-07-11 LAB — PREPARE FRESH FROZEN PLASMA: Unit division: 0

## 2019-07-11 LAB — BPAM FFP
Blood Product Expiration Date: 202010311215
ISSUE DATE / TIME: 202010301514
Unit Type and Rh: 5100

## 2019-07-11 LAB — GLUCOSE, CAPILLARY
Glucose-Capillary: 175 mg/dL — ABNORMAL HIGH (ref 70–99)
Glucose-Capillary: 140 mg/dL — ABNORMAL HIGH (ref 70–99)
Glucose-Capillary: 162 mg/dL — ABNORMAL HIGH (ref 70–99)

## 2019-07-11 LAB — C-REACTIVE PROTEIN: CRP: 13 mg/dL — ABNORMAL HIGH (ref <1.0)

## 2019-07-11 LAB — D-DIMER, QUANTITATIVE: D-Dimer, Quant: 0.56 ug/mL-FEU — ABNORMAL HIGH (ref 0.00–0.50)

## 2019-07-11 MED ORDER — SENNOSIDES-DOCUSATE SODIUM 8.6-50 MG PO TABS: 1.0000 | ORAL_TABLET | Freq: Two times a day (BID) | ORAL | Status: DC | PRN

## 2019-07-11 NOTE — Progress Notes (Signed)
PROGRESS NOTE  Terry Delgado  LFY:[id number] DOB: Jan 16, 1966 DOA: 07/08/2019 PCP: Terry Stains, MD  Brief Narrative: Terry Delgado  is a 53 y.o. male, with history of atrial fibrillation s/p ablation on 04/02/2019, not on anticoagulation who presented with fever cough and shortness of breath.  Patient says that he was diagnosed with Covid on 06/30/2019.  His wife is a Marine scientist who also had COVID-19.  Patient says that he has noticed worsening symptoms of Covid for past 5 days he has had worsening shortness of breath.  He also coughed up blood few days ago.  Patient says that since his diagnosis of Covid he had multiple episodes of heart palpitations.  He has been having multiple episodes of diarrhea.  Denies nausea and vomiting.  Denies headache or blurred vision.  No chest pain.  In the ED COVID-19 test was positive.  Chest x-ray showed multifocal pneumonia.  Procalcitonin 0.37, ferritin 1961, LDH 323, triglyceride 56, CRP 22.4  Patient was found to be hypoxic and requiring nonrebreather. He also complained of abdominal distention and pain.  CT scan of the abdomen showed degree of bowel malrotation no bowel dilation or bowel obstruction.  Delgado surgery was consulted and no surgical intervention recommended at this time.  CRP increased, though hypoxia has remained stable. He's experienced intermittent rapid ventricular rate associated with atrial fibrillation/flutter which is being treated with diltiazem and metoprolol. Convalescent plasma given 10/30.  Assessment & Plan: Active Problems:   Pneumonia due to COVID-19 virus   Congenital malrotation - partial  Acute hypoxic respiratory failure due to covid-19 pneumonia: no PE on CTA chest.  - Continue remdesivir 10/29 - 11/2 - Continue steroids, dose augmented 10/30 - Gave CCP 10/30.  - Severe inflammatory state at admission, CRP now trending downward nicely 31 > 21 > 13. D-dimer not severely elevated.  - Due to possible superimposed bacterial PNA  (elevated PCT and WBC on admission), will continue levaquin pending blood culture data (NG2 and 3D) - Continue airborne, contact precautions. PPE including surgical gown, gloves, cap, shoe covers, and CAPR used during this encounter in a negative pressure room.  - Therapeutic anticoagulation due to AFib/AFlutter - Avoid NSAIDs - Recommend proning and aggressive use of incentive spirometry. - Goals of care were discussed, full code confirmed. Prognosis is guarded.  Atrial fibrillation and flutter with rapid ventricular response: Has intermittently converted back to NSR. Normal biatrial size on echo. TSH wnl.  - Continue po diltiazem 240mg  qd and metoprolol 50mg  BID. BP normotensive, HR control improved, has converted to NSR at this time. Continue IV prn's.  - Continue anticoagulation, lovenox, plan to transition to DOAC at DC. - BNP 111, no clinical heart failure, normal RV systolic function and LVEF 60-65% without mention of diastolic function, normal RA pressure estimate.  - Optimize electrolytes.  Demand ischemia: Very mild elevation of troponin in setting of hypoxic respiratory failure and RVR, since has normalized with no angina. No local wall motion abnormalities on echo. - Continue management of conditions as above.   LFT elevation: Likely due to covid primarily, mild and improving.   - Continue monitoring daily.   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 Pt has bowel sounds, no obstipation, but was hospitalized for SBO that did not require surgery in the past. - Monitor clinically. No peritonitis noted currently. Abd benign.  - Give senna-ducosate prn  Prediabetes: HbA1c 6.3%. - Some hyperglycemia in setting of  steroids, continue SSI.  Obesity: BMI 35. Noted  DVT prophylaxis: Heparin gtt > lovenox Code Status: Full, confirmed Family Communication: Speaking with wife  daily Disposition Plan: Anticipate return to home pending clinical improvement.  Consultants:   Delgado surgery  Procedures:   Echocardiogram Limited 10/30: IMPRESSIONS  1. Left ventricular ejection fraction, by visual estimation, is 65 to 70%. The left ventricle has hyperdynamic function. Normal left ventricular size. There is mildly increased left ventricular hypertrophy.  2. Left ventricular diastolic function could not be evaluated.  3. Global right ventricle has normal systolic function.The right ventricular size is normal. No increase in right ventricular wall thickness.  4. Left atrial size was normal.  5. Right atrial size was normal.  6. The mitral valve is normal in structure. Mild mitral valve regurgitation. No evidence of mitral stenosis.  7. The tricuspid valve is normal in structure. Tricuspid valve regurgitation is mild.  8. The aortic valve is normal in structure. Aortic valve regurgitation is not visualized. No evidence of aortic valve sclerosis or stenosis.  9. The pulmonic valve was normal in structure. Pulmonic valve regurgitation is not visualized. 10. The inferior vena cava is normal in size with greater than 50% respiratory variability, suggesting right atrial pressure of 3 mmHg.  Antimicrobials:  Remdesivir 10/29 - 11/2  Levaquin 10/29 - 11/2  Subjective: Feeling better, less short of breath, but very sleepy today. Stayed up watching football last night. No chest pain or other complaints.    Objective: Vitals:   07/10/19 1900 07/11/19 0000 07/11/19 0300 07/11/19 0754  BP: 122/74 122/86 132/74 122/74  Pulse: 81 68 67 78  Resp: 19 (!) 28 (!) 22 18  Temp: 98.8 F (37.1 C) 98.9 F (37.2 C) 98.7 F (37.1 C) 97.8 F (36.6 C)  TempSrc: Oral Oral Oral Axillary  SpO2: 94% 93% 92% 93%  Weight:      Height:        Intake/Output Summary (Last 24 hours) at 07/11/2019 1053 Last data filed at 07/11/2019 4097 Gross per 24 hour  Intake 577.94 ml  Output 300 ml   Net 277.94 ml   Filed Weights   07/09/19 0300  Weight: 118.1 kg   Gen: 53 y.o. male in no distress Pulm: Nonlabored breathing 15L NRB SpO2 remaining >93%. Crackles diffusely, no wheezing. . CV: Regular rate and rhythm. No murmur, rub, or gallop. No JVD, no dependent edema. GI: Abdomen soft, non-tender, non-distended, with normoactive bowel sounds.  Ext: Warm, no deformities Skin: No rashes, lesions or ulcers on visualized skin. Neuro: Alert and oriented. No focal neurological deficits. Psych: Judgement and insight appear fair. Mood euthymic & affect congruent. Behavior is appropriate.    Data Reviewed: I have personally reviewed following labs and imaging studies  CBC: Recent Labs  Lab 07/08/19 1033 07/09/19 0404 07/10/19 0257 07/11/19 0129  WBC 11.5* 17.1* 14.5* 14.1*  NEUTROABS 10.5* 15.2* 12.3* 11.9*  HGB 14.8 13.1 12.2* 12.4*  HCT 45.0 40.7 38.8* 38.9*  MCV 83.8 85.3 86.2 85.7  PLT 130* 158 193 243   Basic Metabolic Panel: Recent Labs  Lab 07/08/19 1033 07/09/19 0404 07/10/19 0257 07/11/19 0129  NA 133* 138 137 140  K 3.6 4.5 4.8 4.6  CL 101 106 106 109  CO2 20* 24 24 23   GLUCOSE 143* 151* 196* 171*  BUN 13 13 25* 28*  CREATININE 1.09 1.13 1.14 1.03  CALCIUM 8.5* 8.1* 8.3* 8.4*  MG  --  2.0  --   --   PHOS  --  3.2  --   --    GFR: Estimated Creatinine Clearance: 111.3 mL/min (by C-G formula based on SCr of 1.03 mg/dL). Liver Function Tests: Recent Labs  Lab 07/08/19 1033 07/09/19 0404 07/10/19 0257 07/11/19 0129  AST 76* 58* 44* 35  ALT 96* 78* 64* 59*  ALKPHOS 97 96 92 80  BILITOT 1.1 0.9 0.4 0.4  PROT 7.6 7.3 7.1 6.7  ALBUMIN 3.3* 3.0* 3.0* 2.7*   Recent Labs  Lab 07/08/19 1033  LIPASE 27   No results for input(s): AMMONIA in the last 168 hours. Coagulation Profile: Recent Labs  Lab 07/09/19 0404  INR 1.2   Cardiac Enzymes: No results for input(s): CKTOTAL, CKMB, CKMBINDEX, TROPONINI in the last 168 hours. BNP (last 3 results)  No results for input(s): PROBNP in the last 8760 hours. HbA1C: Recent Labs    07/09/19 0404  HGBA1C 6.3*   CBG: Recent Labs  Lab 07/10/19 0713 07/10/19 1106 07/10/19 2030 07/11/19 0753  GLUCAP 174* 158* 146* 175*   Lipid Profile: No results for input(s): CHOL, HDL, LDLCALC, TRIG, CHOLHDL, LDLDIRECT in the last 72 hours. Thyroid Function Tests: Recent Labs    07/10/19 0257  TSH 1.512   Anemia Panel: No results for input(s): VITAMINB12, FOLATE, FERRITIN, TIBC, IRON, RETICCTPCT in the last 72 hours. Urine analysis:    Component Value Date/Time   COLORURINE YELLOW 11/27/2015 0859   APPEARANCEUR CLEAR 11/27/2015 0859   LABSPEC 1.021 11/27/2015 0859   PHURINE 7.0 11/27/2015 0859   GLUCOSEU NEGATIVE 11/27/2015 0859   HGBUR NEGATIVE 11/27/2015 0859   BILIRUBINUR NEGATIVE 11/27/2015 0859   KETONESUR NEGATIVE 11/27/2015 0859   PROTEINUR NEGATIVE 11/27/2015 0859   NITRITE NEGATIVE 11/27/2015 0859   LEUKOCYTESUR NEGATIVE 11/27/2015 0859   Recent Results (from the past 240 hour(s))  Blood Culture (routine x 2)     Status: None (Preliminary result)   Collection Time: 07/08/19 10:33 AM   Specimen: BLOOD  Result Value Ref Range Status   Specimen Description   Final    BLOOD RIGHT ARM Performed at [facility], [address], [place], [place] [address]    Special Requests   Final    BOTTLES DRAWN AEROBIC AND ANAEROBIC Blood Culture adequate volume Performed at [facility], [address], [place], [place] [address]    Culture   Final    NO GROWTH 3 DAYS Performed at [facility], [address] [place], [place] [address]    Report Status PENDING  Incomplete  Blood Culture (routine x 2)     Status: None (Preliminary result)   Collection Time: 07/09/19  4:04 AM   Specimen: BLOOD  Result Value Ref Range Status   Specimen Description   Final    BLOOD RIGHT AC Performed at [facility], [address],  [place], [place] [address]    Special Requests   Final    BOTTLES DRAWN AEROBIC AND ANAEROBIC Blood Culture adequate volume Performed at [facility] [place], [address], [place], [place] [address]    Culture   Final    NO GROWTH 2 DAYS Performed at [facility], [address], [place], [place] [address]    Report Status PENDING  Incomplete      Radiology Studies: No results found.  Scheduled Meds: . aspirin EC  81 mg Oral Daily  . diltiazem  240 mg Oral Daily  . enoxaparin (LOVENOX) injection  1 mg/kg Subcutaneous BID  .  insulin aspart  0-5 Units Subcutaneous QHS  . insulin aspart  0-9 Units Subcutaneous TID WC  . methylPREDNISolone (SOLU-MEDROL) injection  60 mg Intravenous Q12H  . metoprolol tartrate  50 mg Oral BID  . pantoprazole  40 mg Oral Daily  . vitamin C  500 mg Oral Daily  . zinc sulfate  220 mg Oral Daily   Continuous Infusions: . sodium chloride Stopped (07/09/19 0515)  . levofloxacin (LEVAQUIN) IV 750 mg (07/11/19 16100634)  . remdesivir 100 mg in NS 250 mL 100 mg (07/10/19 1607)     LOS: 3 days   Time spent: 35 minutes.  Tyrone Nineyan B Cybill Uriegas, MD [facility] [email or web address] 07/11/2019, 10:53 AM

## 2019-07-12 LAB — GLUCOSE, CAPILLARY
Glucose-Capillary: 149 mg/dL — ABNORMAL HIGH (ref 70–99)
Glucose-Capillary: 178 mg/dL — ABNORMAL HIGH (ref 70–99)
Glucose-Capillary: 166 mg/dL — ABNORMAL HIGH (ref 70–99)
Glucose-Capillary: 184 mg/dL — ABNORMAL HIGH (ref 70–99)
Glucose-Capillary: 151 mg/dL — ABNORMAL HIGH (ref 70–99)
Glucose-Capillary: 145 mg/dL — ABNORMAL HIGH (ref 70–99)
Glucose-Capillary: 164 mg/dL — ABNORMAL HIGH (ref 70–99)

## 2019-07-12 LAB — COMPREHENSIVE METABOLIC PANEL
ALT: 108 U/L — ABNORMAL HIGH (ref 0–44)
AST: 57 U/L — ABNORMAL HIGH (ref 15–41)
Albumin: 3.1 g/dL — ABNORMAL LOW (ref 3.5–5.0)
Alkaline Phosphatase: 82 U/L (ref 38–126)
Anion gap: 10 (ref 5–15)
BUN: 26 mg/dL — ABNORMAL HIGH (ref 6–20)
CO2: 24 mmol/L (ref 22–32)
Calcium: 8.5 mg/dL — ABNORMAL LOW (ref 8.9–10.3)
Chloride: 105 mmol/L (ref 98–111)
Creatinine, Ser: 1.03 mg/dL (ref 0.61–1.24)
GFR calc Af Amer: >60 mL/min (ref >60)
GFR calc non Af Amer: >60 mL/min (ref >60)
Glucose, Bld: 151 mg/dL — ABNORMAL HIGH (ref 70–99)
Potassium: 4.8 mmol/L (ref 3.5–5.1)
Sodium: 139 mmol/L (ref 135–145)
Total Bilirubin: 1.2 mg/dL (ref 0.3–1.2)
Total Protein: 7.1 g/dL (ref 6.5–8.1)

## 2019-07-12 LAB — CBC WITH DIFFERENTIAL/PLATELET
Abs Immature Granulocytes: 0.4 10*3/uL — ABNORMAL HIGH (ref 0.00–0.07)
Basophils Absolute: 0.1 10*3/uL (ref 0.0–0.1)
Basophils Relative: 1 %
Eosinophils Absolute: 0.1 10*3/uL (ref 0.0–0.5)
Eosinophils Relative: 0 %
HCT: 45.1 % (ref 39.0–52.0)
Hemoglobin: 14.4 g/dL (ref 13.0–17.0)
Immature Granulocytes: 3 %
Lymphocytes Relative: 8 %
Lymphs Abs: 1.1 10*3/uL (ref 0.7–4.0)
MCH: 27.4 pg (ref 26.0–34.0)
MCHC: 31.9 g/dL (ref 30.0–36.0)
MCV: 85.7 fL (ref 80.0–100.0)
Monocytes Absolute: 0.6 10*3/uL (ref 0.1–1.0)
Monocytes Relative: 5 %
Neutro Abs: 11.2 10*3/uL — ABNORMAL HIGH (ref 1.7–7.7)
Neutrophils Relative %: 83 %
Platelets: 256 10*3/uL (ref 150–400)
RBC: 5.26 MIL/uL (ref 4.22–5.81)
RDW: 13.4 % (ref 11.5–15.5)
WBC: 13.4 10*3/uL — ABNORMAL HIGH (ref 4.0–10.5)
nRBC: 0 % (ref 0.0–0.2)

## 2019-07-12 LAB — D-DIMER, QUANTITATIVE: D-Dimer, Quant: 0.79 ug/mL-FEU — ABNORMAL HIGH (ref 0.00–0.50)

## 2019-07-12 LAB — C-REACTIVE PROTEIN: CRP: 8.6 mg/dL — ABNORMAL HIGH (ref <1.0)

## 2019-07-12 NOTE — Progress Notes (Signed)
PROGRESS NOTE  Terry Delgado  ZHY:[id number] DOB: April 05, 1966 DOA: 07/08/2019 PCP: Laurann MontanaWhite, Cynthia, MD  Brief Narrative: Terry Delgado  is a 53 y.o. male, with history of atrial fibrillation s/p ablation on 04/02/2019, not on anticoagulation who presented with fever cough and shortness of breath.  Patient says that he was diagnosed with Covid on 06/30/2019.  His wife is a Engineer, civil (consulting)nurse who also had COVID-19.  Patient says that he has noticed worsening symptoms of Covid for past 5 days he has had worsening shortness of breath.  He also coughed up blood few days ago.  Patient says that since his diagnosis of Covid he had multiple episodes of heart palpitations.  He has been having multiple episodes of diarrhea.  Denies nausea and vomiting.  Denies headache or blurred vision.  No chest pain.  In the ED COVID-19 test was positive.  Chest x-ray showed multifocal pneumonia.  Procalcitonin 0.37, ferritin 1961, LDH 323, triglyceride 56, CRP 22.4  Patient was found to be hypoxic and requiring nonrebreather. He also complained of abdominal distention and pain.  CT scan of the abdomen showed degree of bowel malrotation no bowel dilation or bowel obstruction.  General surgery was consulted and no surgical intervention recommended at this time.  CRP increased, though hypoxia has remained stable. He's experienced intermittent rapid ventricular rate associated with atrial fibrillation/flutter which is being treated with diltiazem and metoprolol. Convalescent plasma given 10/30.  Assessment & Plan: Active Problems:   Pneumonia due to COVID-19 virus   Congenital malrotation - partial  Acute hypoxic respiratory failure due to covid-19 pneumonia: no PE on CTA chest.  - Continue remdesivir 10/29 - 11/2 - Continue steroids, dose augmented 10/30, may decrease back 11/3 if CRP and clinical status continue improving . - Gave CCP 10/30.  - Severe inflammatory state at admission, CRP now trending downward nicely 31 > 21 > 13.  D-dimer not severely elevated.  - Due to possible superimposed bacterial PNA (elevated PCT and WBC on admission), will continue levaquin pending blood culture data (NG3 and 4D) - Continue airborne, contact precautions. PPE including surgical gown, gloves, cap, shoe covers, and CAPR used during this encounter in a negative pressure room.  - Therapeutic anticoagulation due to AFib/AFlutter - Avoid NSAIDs - Recommend proning and aggressive use of incentive spirometry. - Goals of care were discussed, full code confirmed. Prognosis is guarded.  Paroxysmal atrial fibrillation and flutter with rapid ventricular response: Returned to NSR durably since improvement in hypoxia/respiratory distress. Normal biatrial size on echo. TSH wnl.  - Continue po diltiazem 240mg  qd and metoprolol 50mg  BID. BP normotensive, HR control improved, has converted to NSR at this time. Continue IV prn's.  - Continue anticoagulation, lovenox, in the event that he reverts to AFib and requires DCCV. Will need cardiology follow up.  - BNP 111, no clinical heart failure, normal RV systolic function and LVEF 60-65% without mention of diastolic function, normal RA pressure estimate.  - Optimize electrolytes.  Demand ischemia: Very mild elevation of troponin in setting of hypoxic respiratory failure and RVR, since has normalized with no angina. No local wall motion abnormalities on echo. - Continue management of conditions as above.   LFT elevation: Likely due to covid primarily. Up 11/2 - Continue monitoring daily. Ok to give final dose remdesivir today.  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 Pt has bowel sounds, no obstipation, but was hospitalized for SBO that did  not require surgery in the past. - Monitor clinically. Abd remains benign without obstipation. - Give senna-ducosate prn  Prediabetes:  HbA1c 6.3%. - Some hyperglycemia in setting of steroids, continue SSI.  Obesity: BMI 35. Noted  DVT prophylaxis: Heparin gtt > lovenox Code Status: Full, confirmed Family Communication: Speaking with wife daily Disposition Plan: Anticipate return to home pending clinical improvement.  Consultants:   General surgery  Procedures:   Echocardiogram Limited 10/30: IMPRESSIONS  1. Left ventricular ejection fraction, by visual estimation, is 65 to 70%. The left ventricle has hyperdynamic function. Normal left ventricular size. There is mildly increased left ventricular hypertrophy.  2. Left ventricular diastolic function could not be evaluated.  3. Global right ventricle has normal systolic function.The right ventricular size is normal. No increase in right ventricular wall thickness.  4. Left atrial size was normal.  5. Right atrial size was normal.  6. The mitral valve is normal in structure. Mild mitral valve regurgitation. No evidence of mitral stenosis.  7. The tricuspid valve is normal in structure. Tricuspid valve regurgitation is mild.  8. The aortic valve is normal in structure. Aortic valve regurgitation is not visualized. No evidence of aortic valve sclerosis or stenosis.  9. The pulmonic valve was normal in structure. Pulmonic valve regurgitation is not visualized. 10. The inferior vena cava is normal in size with greater than 50% respiratory variability, suggesting right atrial pressure of 3 mmHg.  Antimicrobials:  Remdesivir 10/29 - 11/2  Levaquin 10/29 - 11/2  Subjective: Breathing easier today than yesterday, making steady improvements. No chest pain. +flatus, no abd pain, no BM yet.    Objective: Vitals:   07/12/19 0403 07/12/19 0404 07/12/19 0408 07/12/19 0800  BP: 125/82   126/77  Pulse: 76 76 76 73  Resp: (!) 31 (!) 27 (!) 22 (!) 22  Temp: 98.3 F (36.8 C)   98.2 F (36.8 C)  TempSrc: Oral   Oral  SpO2: 95% 94% 96%   Weight:      Height:         Intake/Output Summary (Last 24 hours) at 07/12/2019 1045 Last data filed at 07/12/2019 0405 Gross per 24 hour  Intake 760 ml  Output 1750 ml  Net -990 ml   Filed Weights   07/09/19 0300  Weight: 118.1 kg   Gen: 53 y.o. male in no distress Pulm: Nonlabored breathing 6L O2 by [place]. Crackles improved. CV: Regular rate and rhythm. No murmur, rub, or gallop. No JVD, no dependent edema. GI: Abdomen soft, protuberant, not taut, non-tender, non-distended, with normoactive bowel sounds.  Ext: Warm, no deformities Skin: No rashes, lesions or ulcers on visualized skin. Neuro: Alert and oriented. No focal neurological deficits. Psych: Judgement and insight appear fair. Mood euthymic & affect congruent. Behavior is appropriate.    Data Reviewed: I have personally reviewed following labs and imaging studies  CBC: Recent Labs  Lab 07/08/19 1033 07/09/19 0404 07/10/19 0257 07/11/19 0129 07/12/19 0355  WBC 11.5* 17.1* 14.5* 14.1* 13.4*  NEUTROABS 10.5* 15.2* 12.3* 11.9* 11.2*  HGB 14.8 13.1 12.2* 12.4* 14.4  HCT 45.0 40.7 38.8* 38.9* 45.1  MCV 83.8 85.3 86.2 85.7 85.7  PLT 130* 158 193 243 505   Basic Metabolic Panel: Recent Labs  Lab 07/08/19 1033 07/09/19 0404 07/10/19 0257 07/11/19 0129 07/12/19 0355  NA 133* 138 137 140 139  K 3.6 4.5 4.8 4.6 4.8  CL 101 106 106 109 105  CO2 20* 24 24 23 24   GLUCOSE 143* 151* 196*  171* 151*  BUN 13 13 25* 28* 26*  CREATININE 1.09 1.13 1.14 1.03 1.03  CALCIUM 8.5* 8.1* 8.3* 8.4* 8.5*  MG  --  2.0  --   --   --   PHOS  --  3.2  --   --   --    GFR: Estimated Creatinine Clearance: 111.3 mL/min (by C-G formula based on SCr of 1.03 mg/dL). Liver Function Tests: Recent Labs  Lab 07/08/19 1033 07/09/19 0404 07/10/19 0257 07/11/19 0129 07/12/19 0355  AST 76* 58* 44* 35 57*  ALT 96* 78* 64* 59* 108*  ALKPHOS 97 96 92 80 82  BILITOT 1.1 0.9 0.4 0.4 1.2  PROT 7.6 7.3 7.1 6.7 7.1  ALBUMIN 3.3* 3.0* 3.0* 2.7* 3.1*   Recent Labs  Lab  07/08/19 1033  LIPASE 27   No results for input(s): AMMONIA in the last 168 hours. Coagulation Profile: Recent Labs  Lab 07/09/19 0404  INR 1.2   Cardiac Enzymes: No results for input(s): CKTOTAL, CKMB, CKMBINDEX, TROPONINI in the last 168 hours. BNP (last 3 results) No results for input(s): PROBNP in the last 8760 hours. HbA1C: No results for input(s): HGBA1C in the last 72 hours. CBG: Recent Labs  Lab 07/11/19 0753 07/11/19 1146 07/11/19 1615 07/11/19 2001 07/12/19 0848  GLUCAP 175* 140* 184* 162* 151*   Lipid Profile: No results for input(s): CHOL, HDL, LDLCALC, TRIG, CHOLHDL, LDLDIRECT in the last 72 hours. Thyroid Function Tests: Recent Labs    07/10/19 0257  TSH 1.512   Anemia Panel: No results for input(s): VITAMINB12, FOLATE, FERRITIN, TIBC, IRON, RETICCTPCT in the last 72 hours. Urine analysis:    Component Value Date/Time   COLORURINE YELLOW 11/27/2015 0859   APPEARANCEUR CLEAR 11/27/2015 0859   LABSPEC 1.021 11/27/2015 0859   PHURINE 7.0 11/27/2015 0859   GLUCOSEU NEGATIVE 11/27/2015 0859   HGBUR NEGATIVE 11/27/2015 0859   BILIRUBINUR NEGATIVE 11/27/2015 0859   KETONESUR NEGATIVE 11/27/2015 0859   PROTEINUR NEGATIVE 11/27/2015 0859   NITRITE NEGATIVE 11/27/2015 0859   LEUKOCYTESUR NEGATIVE 11/27/2015 0859   Recent Results (from the past 240 hour(s))  Blood Culture (routine x 2)     Status: None (Preliminary result)   Collection Time: 07/08/19 10:33 AM   Specimen: BLOOD  Result Value Ref Range Status   Specimen Description   Final    BLOOD RIGHT ARM Performed at [facility], [address], [place], [place] [address]    Special Requests   Final    BOTTLES DRAWN AEROBIC AND ANAEROBIC Blood Culture adequate volume Performed at [facility], [address] [place], [place] [address]    Culture   Final    NO GROWTH 4 DAYS Performed at [facility], [address] [place], [place] [address]     Report Status PENDING  Incomplete  Blood Culture (routine x 2)     Status: None (Preliminary result)   Collection Time: 07/09/19  4:04 AM   Specimen: BLOOD  Result Value Ref Range Status   Specimen Description   Final    BLOOD RIGHT AC Performed at [facility], [address], [place], [place] [address]    Special Requests   Final    BOTTLES DRAWN AEROBIC AND ANAEROBIC Blood Culture adequate volume Performed at [facility], [address], [place], [place] [address]    Culture   Final    NO GROWTH 3 DAYS Performed at [facility], [address] [place], [place] [address]    Report Status PENDING  Incomplete      Radiology Studies: No results found.  Scheduled Meds: . aspirin EC  81 mg Oral Daily  . diltiazem  240 mg Oral Daily  . enoxaparin (LOVENOX) injection  1 mg/kg Subcutaneous BID  . insulin aspart  0-5 Units Subcutaneous QHS  . insulin aspart  0-9 Units Subcutaneous TID WC  . methylPREDNISolone (SOLU-MEDROL) injection  60 mg Intravenous Q12H  . metoprolol tartrate  50 mg Oral BID  . pantoprazole  40 mg Oral Daily  . vitamin C  500 mg Oral Daily  . zinc sulfate  220 mg Oral Daily   Continuous Infusions: . sodium chloride Stopped (07/09/19 0515)  . levofloxacin (LEVAQUIN) IV 750 mg (07/12/19 0606)  . remdesivir 100 mg in NS 250 mL Stopped (07/11/19 1800)     LOS: 4 days   Time spent: 25 minutes.  Tyrone Nine, MD [facility] [email or web address] 07/12/2019, 10:45 AM

## 2019-07-12 NOTE — Plan of Care (Addendum)
Patient in bed resting. No s/s of pain or distress. All medication given well tolerated. Called wife Beckie Busing with a care plan update. Will continue to monitor for remainder of shift.  Problem: Education: Goal: Knowledge of risk factors and measures for prevention of condition will improve 07/12/2019 0738 by Orvan Falconer, RN Outcome: Progressing 07/12/2019 0738 by Orvan Falconer, RN Outcome: Progressing   Problem: Coping: Goal: Psychosocial and spiritual needs will be supported 07/12/2019 0738 by Orvan Falconer, RN Outcome: Progressing 07/12/2019 0738 by Orvan Falconer, RN Outcome: Progressing   Problem: Respiratory: Goal: Will maintain a patent airway 07/12/2019 0738 by Orvan Falconer, RN Outcome: Progressing 07/12/2019 0738 by Orvan Falconer, RN Outcome: Progressing Goal: Complications related to the disease process, condition or treatment will be avoided or minimized 07/12/2019 0738 by Orvan Falconer, RN Outcome: Progressing 07/12/2019 0738 by Orvan Falconer, RN Outcome: Progressing   Problem: Education: Goal: Knowledge of General Education information will improve Description: Including pain rating scale, medication(s)/side effects and non-pharmacologic comfort measures 07/12/2019 0738 by Orvan Falconer, RN Outcome: Progressing 07/12/2019 0738 by Orvan Falconer, RN Outcome: Progressing   Problem: Health Behavior/Discharge Planning: Goal: Ability to manage health-related needs will improve 07/12/2019 0738 by Orvan Falconer, RN Outcome: Progressing 07/12/2019 0738 by Orvan Falconer, RN Outcome: Progressing   Problem: Clinical Measurements: Goal: Ability to maintain clinical measurements within normal limits will improve 07/12/2019 0738 by Orvan Falconer, RN Outcome: Progressing 07/12/2019 0738 by Orvan Falconer, RN Outcome: Progressing Goal: Will remain free from infection 07/12/2019 0738 by Orvan Falconer, RN Outcome:  Progressing 07/12/2019 0738 by Orvan Falconer, RN Outcome: Progressing Goal: Diagnostic test results will improve 07/12/2019 0738 by Orvan Falconer, RN Outcome: Progressing 07/12/2019 0738 by Orvan Falconer, RN Outcome: Progressing Goal: Respiratory complications will improve 07/12/2019 0738 by Orvan Falconer, RN Outcome: Progressing 07/12/2019 0738 by Orvan Falconer, RN Outcome: Progressing Goal: Cardiovascular complication will be avoided 07/12/2019 0738 by Orvan Falconer, RN Outcome: Progressing 07/12/2019 0738 by Orvan Falconer, RN Outcome: Progressing   Problem: Activity: Goal: Risk for activity intolerance will decrease 07/12/2019 0738 by Orvan Falconer, RN Outcome: Progressing 07/12/2019 0738 by Orvan Falconer, RN Outcome: Progressing   Problem: Nutrition: Goal: Adequate nutrition will be maintained 07/12/2019 0738 by Orvan Falconer, RN Outcome: Progressing 07/12/2019 0738 by Orvan Falconer, RN Outcome: Progressing   Problem: Coping: Goal: Level of anxiety will decrease 07/12/2019 0738 by Orvan Falconer, RN Outcome: Progressing 07/12/2019 0738 by Orvan Falconer, RN Outcome: Progressing   Problem: Elimination: Goal: Will not experience complications related to bowel motility 07/12/2019 0738 by Orvan Falconer, RN Outcome: Progressing 07/12/2019 0738 by Orvan Falconer, RN Outcome: Progressing Goal: Will not experience complications related to urinary retention 07/12/2019 0738 by Orvan Falconer, RN Outcome: Progressing 07/12/2019 0738 by Orvan Falconer, RN Outcome: Progressing   Problem: Pain Managment: Goal: General experience of comfort will improve 07/12/2019 0738 by Orvan Falconer, RN Outcome: Progressing 07/12/2019 0738 by Orvan Falconer, RN Outcome: Progressing   Problem: Safety: Goal: Ability to remain free from injury will improve 07/12/2019 0738 by Orvan Falconer, RN Outcome:  Progressing 07/12/2019 0738 by Orvan Falconer, RN Outcome: Progressing   Problem: Skin Integrity: Goal: Risk for impaired skin integrity will decrease 07/12/2019 0738 by Orvan Falconer, RN Outcome: Progressing 07/12/2019 0738 by Orvan Falconer, RN Outcome: Progressing

## 2019-07-13 LAB — CBC WITH DIFFERENTIAL/PLATELET
Abs Immature Granulocytes: 0.57 10*3/uL — ABNORMAL HIGH (ref 0.00–0.07)
Basophils Absolute: 0.1 10*3/uL (ref 0.0–0.1)
Basophils Relative: 1 %
Eosinophils Absolute: 0 10*3/uL (ref 0.0–0.5)
Eosinophils Relative: 0 %
HCT: 43.5 % (ref 39.0–52.0)
Hemoglobin: 13.9 g/dL (ref 13.0–17.0)
Immature Granulocytes: 4 %
Lymphocytes Relative: 8 %
Lymphs Abs: 1.2 10*3/uL (ref 0.7–4.0)
MCH: 27.2 pg (ref 26.0–34.0)
MCHC: 32 g/dL (ref 30.0–36.0)
MCV: 85.1 fL (ref 80.0–100.0)
Monocytes Absolute: 0.7 10*3/uL (ref 0.1–1.0)
Monocytes Relative: 5 %
Neutro Abs: 11.9 10*3/uL — ABNORMAL HIGH (ref 1.7–7.7)
Neutrophils Relative %: 82 %
Platelets: 269 10*3/uL (ref 150–400)
RBC: 5.11 MIL/uL (ref 4.22–5.81)
RDW: 13.3 % (ref 11.5–15.5)
WBC: 14.4 10*3/uL — ABNORMAL HIGH (ref 4.0–10.5)
nRBC: 0 % (ref 0.0–0.2)

## 2019-07-13 LAB — GLUCOSE, CAPILLARY
Glucose-Capillary: 162 mg/dL — ABNORMAL HIGH (ref 70–99)
Glucose-Capillary: 176 mg/dL — ABNORMAL HIGH (ref 70–99)
Glucose-Capillary: 229 mg/dL — ABNORMAL HIGH (ref 70–99)
Glucose-Capillary: 225 mg/dL — ABNORMAL HIGH (ref 70–99)

## 2019-07-13 LAB — C-REACTIVE PROTEIN: CRP: 5.4 mg/dL — ABNORMAL HIGH (ref <1.0)

## 2019-07-13 LAB — COMPREHENSIVE METABOLIC PANEL
ALT: 144 U/L — ABNORMAL HIGH (ref 0–44)
AST: 56 U/L — ABNORMAL HIGH (ref 15–41)
Albumin: 2.8 g/dL — ABNORMAL LOW (ref 3.5–5.0)
Alkaline Phosphatase: 78 U/L (ref 38–126)
Anion gap: 7 (ref 5–15)
BUN: 24 mg/dL — ABNORMAL HIGH (ref 6–20)
CO2: 24 mmol/L (ref 22–32)
Calcium: 8.2 mg/dL — ABNORMAL LOW (ref 8.9–10.3)
Chloride: 107 mmol/L (ref 98–111)
Creatinine, Ser: 1.1 mg/dL (ref 0.61–1.24)
GFR calc Af Amer: >60 mL/min (ref >60)
GFR calc non Af Amer: >60 mL/min (ref >60)
Glucose, Bld: 179 mg/dL — ABNORMAL HIGH (ref 70–99)
Potassium: 4.7 mmol/L (ref 3.5–5.1)
Sodium: 138 mmol/L (ref 135–145)
Total Bilirubin: 0.7 mg/dL (ref 0.3–1.2)
Total Protein: 6.3 g/dL — ABNORMAL LOW (ref 6.5–8.1)

## 2019-07-13 LAB — CULTURE, BLOOD (ROUTINE X 2)
Culture: NO GROWTH
Special Requests: ADEQUATE

## 2019-07-13 LAB — D-DIMER, QUANTITATIVE: D-Dimer, Quant: 0.68 ug/mL-FEU — ABNORMAL HIGH (ref 0.00–0.50)

## 2019-07-13 MED ORDER — SENNOSIDES-DOCUSATE SODIUM 8.6-50 MG PO TABS
1.0000 | ORAL_TABLET | Freq: Two times a day (BID) | ORAL | Status: DC
  Administered 2019-07-13 – 2019-07-16 (×7): 1 via ORAL
  Filled 2019-07-13 (×6): qty 1

## 2019-07-13 NOTE — Plan of Care (Signed)
  Problem: Education: Goal: Knowledge of risk factors and measures for prevention of condition will improve 07/13/2019 0819 by Orvan Falconer, RN Outcome: Progressing 07/13/2019 0818 by Orvan Falconer, RN Outcome: Progressing   Problem: Coping: Goal: Psychosocial and spiritual needs will be supported 07/13/2019 0819 by Orvan Falconer, RN Outcome: Progressing 07/13/2019 0818 by Orvan Falconer, RN Outcome: Progressing   Problem: Respiratory: Goal: Will maintain a patent airway 07/13/2019 0819 by Orvan Falconer, RN Outcome: Progressing 07/13/2019 0818 by Orvan Falconer, RN Outcome: Progressing Goal: Complications related to the disease process, condition or treatment will be avoided or minimized 07/13/2019 0819 by Orvan Falconer, RN Outcome: Progressing 07/13/2019 0818 by Orvan Falconer, RN Outcome: Progressing   Problem: Education: Goal: Knowledge of General Education information will improve Description: Including pain rating scale, medication(s)/side effects and non-pharmacologic comfort measures 07/13/2019 0819 by Orvan Falconer, RN Outcome: Progressing 07/13/2019 0818 by Orvan Falconer, RN Outcome: Progressing   Problem: Health Behavior/Discharge Planning: Goal: Ability to manage health-related needs will improve 07/13/2019 0819 by Orvan Falconer, RN Outcome: Progressing 07/13/2019 0818 by Orvan Falconer, RN Outcome: Progressing   Problem: Clinical Measurements: Goal: Ability to maintain clinical measurements within normal limits will improve 07/13/2019 0819 by Orvan Falconer, RN Outcome: Progressing 07/13/2019 0818 by Orvan Falconer, RN Outcome: Progressing Goal: Will remain free from infection 07/13/2019 0819 by Orvan Falconer, RN Outcome: Progressing 07/13/2019 0818 by Orvan Falconer, RN Outcome: Progressing Goal: Diagnostic test results will improve 07/13/2019 0819 by Orvan Falconer, RN Outcome:  Progressing 07/13/2019 0818 by Orvan Falconer, RN Outcome: Progressing Goal: Respiratory complications will improve 07/13/2019 0819 by Orvan Falconer, RN Outcome: Progressing 07/13/2019 0818 by Orvan Falconer, RN Outcome: Progressing Goal: Cardiovascular complication will be avoided 07/13/2019 0819 by Orvan Falconer, RN Outcome: Progressing 07/13/2019 0818 by Orvan Falconer, RN Outcome: Progressing   Problem: Activity: Goal: Risk for activity intolerance will decrease 07/13/2019 0819 by Orvan Falconer, RN Outcome: Progressing 07/13/2019 0818 by Orvan Falconer, RN Outcome: Progressing   Problem: Nutrition: Goal: Adequate nutrition will be maintained 07/13/2019 0819 by Orvan Falconer, RN Outcome: Progressing 07/13/2019 0818 by Orvan Falconer, RN Outcome: Progressing   Problem: Coping: Goal: Level of anxiety will decrease 07/13/2019 0819 by Orvan Falconer, RN Outcome: Progressing 07/13/2019 0818 by Orvan Falconer, RN Outcome: Progressing   Problem: Elimination: Goal: Will not experience complications related to bowel motility 07/13/2019 0819 by Orvan Falconer, RN Outcome: Progressing 07/13/2019 0818 by Orvan Falconer, RN Outcome: Progressing Goal: Will not experience complications related to urinary retention 07/13/2019 0819 by Orvan Falconer, RN Outcome: Progressing 07/13/2019 0818 by Orvan Falconer, RN Outcome: Progressing   Problem: Pain Managment: Goal: General experience of comfort will improve 07/13/2019 0819 by Orvan Falconer, RN Outcome: Progressing 07/13/2019 0818 by Orvan Falconer, RN Outcome: Progressing   Problem: Safety: Goal: Ability to remain free from injury will improve 07/13/2019 0819 by Orvan Falconer, RN Outcome: Progressing 07/13/2019 0818 by Orvan Falconer, RN Outcome: Progressing   Problem: Skin Integrity: Goal: Risk for impaired skin integrity will decrease 07/13/2019 0819 by  Orvan Falconer, RN Outcome: Progressing 07/13/2019 0818 by Orvan Falconer, RN Outcome: Progressing

## 2019-07-13 NOTE — Progress Notes (Addendum)
PROGRESS NOTE  Terry Delgado  IRC:[id number] DOB: [birth year] DOA: 07/08/2019 PCP: Harlan Stains, MD  Brief Narrative: Terry Delgado  is a 53 y.o. male, with history of atrial fibrillation s/p ablation on 04/02/2019, not on anticoagulation who presented with fever cough and shortness of breath.  Patient says that he was diagnosed with Covid on 06/30/2019.  His wife is a Marine scientist who also had COVID-19.  Patient says that he has noticed worsening symptoms of Covid for past 5 days he has had worsening shortness of breath.  He also coughed up blood few days ago.  Patient says that since his diagnosis of Covid he had multiple episodes of heart palpitations.  He has been having multiple episodes of diarrhea.  Denies nausea and vomiting.  Denies headache or blurred vision.  No chest pain.  In the ED COVID-19 test was positive.  Chest x-ray showed multifocal pneumonia.  Procalcitonin 0.37, ferritin 1961, LDH 323, triglyceride 56, CRP 22.4  Patient was found to be hypoxic and requiring nonrebreather. He also complained of abdominal distention and pain.  CT scan of the abdomen showed degree of bowel malrotation no bowel dilation or bowel obstruction.  General surgery was consulted and no surgical intervention recommended at this time.  CRP increased, though hypoxia has remained stable. He's experienced intermittent rapid ventricular rate associated with atrial fibrillation/flutter which is being treated with diltiazem and metoprolol. Convalescent plasma given 10/30.  Assessment & Plan: Active Problems:   Pneumonia due to COVID-19 virus   Congenital malrotation - partial  Acute hypoxic respiratory failure due to covid-19 pneumonia: no PE on CTA chest.  - Continue remdesivir 10/29 - 11/2 - Continue steroids, dose augmented 10/30 - Gave CCP 10/30.  - Severe inflammatory state at admission, CRP now trending downward. D-dimer not severely elevated.  - Due to possible superimposed bacterial PNA (elevated PCT and  WBC on admission), continued levaquin x5 days pending blood culture data (NG4 and 5D) - Continue airborne, contact precautions. PPE including surgical gown, gloves, cap, shoe covers, and CAPR used during this encounter in a negative pressure room.  - Therapeutic anticoagulation due to AFib/AFlutter - Avoid NSAIDs - Recommend proning and aggressive use of incentive spirometry. - Goals of care were discussed, full code confirmed. Prognosis is guarded.  Paroxysmal atrial fibrillation and flutter with rapid ventricular response: Returned to NSR durably since improvement in hypoxia/respiratory distress. Normal biatrial size on echo. TSH wnl.  - Continue po diltiazem 240mg  qd and metoprolol 50mg  BID. BP normotensive, HR control improved, has converted to NSR at this time. Continue IV prn's.  - Continue anticoagulation, lovenox, in the event that he reverts to AFib and requires DCCV. Will need cardiology follow up.  - BNP 111, no clinical heart failure, normal RV systolic function and LVEF 60-65% without mention of diastolic function, normal RA pressure estimate.  - Optimize electrolytes.  Demand ischemia: Very mild elevation of troponin in setting of hypoxic respiratory failure and RVR, since has normalized with no angina. No local wall motion abnormalities on echo. - Continue management of conditions as above.   LFT elevation: Likely due to covid primarily. Up 11/2 - Continue monitoring daily.   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 Pt has bowel sounds, no obstipation, but was hospitalized for SBO that did not require surgery in the past. - Monitor clinically. Abd remains benign without obstipation. - Give senna-ducosate. Hasn't taken any prns, will schedule  and continue monitoring.  Prediabetes: HbA1c 6.3%. - Some hyperglycemia in setting of steroids, continue SSI.   Obesity: BMI 35. Noted  DVT prophylaxis: Heparin gtt > lovenox Code Status: Full, confirmed Family Communication: Speaking with wife daily. No answer today. Disposition Plan: Anticipate return to home pending clinical improvement.  Consultants:   General surgery  Procedures:   Echocardiogram Limited 10/30: IMPRESSIONS  1. Left ventricular ejection fraction, by visual estimation, is 65 to 70%. The left ventricle has hyperdynamic function. Normal left ventricular size. There is mildly increased left ventricular hypertrophy.  2. Left ventricular diastolic function could not be evaluated.  3. Global right ventricle has normal systolic function.The right ventricular size is normal. No increase in right ventricular wall thickness.  4. Left atrial size was normal.  5. Right atrial size was normal.  6. The mitral valve is normal in structure. Mild mitral valve regurgitation. No evidence of mitral stenosis.  7. The tricuspid valve is normal in structure. Tricuspid valve regurgitation is mild.  8. The aortic valve is normal in structure. Aortic valve regurgitation is not visualized. No evidence of aortic valve sclerosis or stenosis.  9. The pulmonic valve was normal in structure. Pulmonic valve regurgitation is not visualized. 10. The inferior vena cava is normal in size with greater than 50% respiratory variability, suggesting right atrial pressure of 3 mmHg.  Antimicrobials:  Remdesivir 10/29 - 11/2  Levaquin 10/29 - 11/2  Subjective: Breathing better, not up yet. No BM yet, +flatus. No chest pain or bleeding  Objective: Vitals:   07/12/19 2035 07/13/19 0005 07/13/19 0524 07/13/19 0715  BP: 133/65 130/70 138/86 140/81  Pulse: 84 70 75 70  Resp: (!) 22 (!) 22 18 18   Temp: 98 F (36.7 C) 98.6 F (37 C) 98.3 F (36.8 C) 98.3 F (36.8 C)  TempSrc: Oral Oral Oral Oral  SpO2: 95% 93% 92%   Weight:      Height:        Intake/Output Summary (Last 24 hours) at 07/13/2019 0820 Last  data filed at 07/12/2019 1030 Gross per 24 hour  Intake -  Output 450 ml  Net -450 ml   Filed Weights   07/09/19 0300  Weight: 118.1 kg   Gen: 53 y.o. male in no distress Pulm: Nonlabored breathing. Crackles bilaterally. CV: Regular rate and rhythm. No murmur, rub, or gallop. No JVD, no dependent edema. GI: Abdomen soft, non-tender, non-distended, with normoactive bowel sounds.  Ext: Warm, no deformities Skin: No rashes, lesions or ulcers on visualized skin. Neuro: Alert and oriented. No focal neurological deficits. Psych: Judgement and insight appear fair. Mood euthymic & affect congruent. Behavior is appropriate.     Data Reviewed: I have personally reviewed following labs and imaging studies  CBC: Recent Labs  Lab 07/09/19 0404 07/10/19 0257 07/11/19 0129 07/12/19 0355 07/13/19 0440  WBC 17.1* 14.5* 14.1* 13.4* 14.4*  NEUTROABS 15.2* 12.3* 11.9* 11.2* 11.9*  HGB 13.1 12.2* 12.4* 14.4 13.9  HCT 40.7 38.8* 38.9* 45.1 43.5  MCV 85.3 86.2 85.7 85.7 85.1  PLT 158 193 243 256 269   Basic Metabolic Panel: Recent Labs  Lab 07/09/19 0404 07/10/19 0257 07/11/19 0129 07/12/19 0355 07/13/19 0440  NA 138 137 140 139 138  K 4.5 4.8 4.6 4.8 4.7  CL 106 106 109 105 107  CO2 24 24 23 24 24   GLUCOSE 151* 196* 171* 151* 179*  BUN 13 25* 28* 26* 24*  CREATININE 1.13 1.14 1.03 1.03 1.10  CALCIUM 8.1* 8.3* 8.4*  8.5* 8.2*  MG 2.0  --   --   --   --   PHOS 3.2  --   --   --   --    GFR: Estimated Creatinine Clearance: 104.2 mL/min (by C-G formula based on SCr of 1.1 mg/dL). Liver Function Tests: Recent Labs  Lab 07/09/19 0404 07/10/19 0257 07/11/19 0129 07/12/19 0355 07/13/19 0440  AST 58* 44* 35 57* 56*  ALT 78* 64* 59* 108* 144*  ALKPHOS 96 92 80 82 78  BILITOT 0.9 0.4 0.4 1.2 0.7  PROT 7.3 7.1 6.7 7.1 6.3*  ALBUMIN 3.0* 3.0* 2.7* 3.1* 2.8*   Recent Labs  Lab 07/08/19 1033  LIPASE 27   No results for input(s): AMMONIA in the last 168 hours. Coagulation  Profile: Recent Labs  Lab 07/09/19 0404  INR 1.2   Cardiac Enzymes: No results for input(s): CKTOTAL, CKMB, CKMBINDEX, TROPONINI in the last 168 hours. BNP (last 3 results) No results for input(s): PROBNP in the last 8760 hours. HbA1C: No results for input(s): HGBA1C in the last 72 hours. CBG: Recent Labs  Lab 07/11/19 1615 07/11/19 2001 07/12/19 0848 07/12/19 1224 07/12/19 1614  GLUCAP 184* 162* 151* 145* 164*   Lipid Profile: No results for input(s): CHOL, HDL, LDLCALC, TRIG, CHOLHDL, LDLDIRECT in the last 72 hours. Thyroid Function Tests: No results for input(s): TSH, T4TOTAL, FREET4, T3FREE, THYROIDAB in the last 72 hours. Anemia Panel: No results for input(s): VITAMINB12, FOLATE, FERRITIN, TIBC, IRON, RETICCTPCT in the last 72 hours. Urine analysis:    Component Value Date/Time   COLORURINE YELLOW 11/27/2015 0859   APPEARANCEUR CLEAR 11/27/2015 0859   LABSPEC 1.021 11/27/2015 0859   PHURINE 7.0 11/27/2015 0859   GLUCOSEU NEGATIVE 11/27/2015 0859   HGBUR NEGATIVE 11/27/2015 0859   BILIRUBINUR NEGATIVE 11/27/2015 0859   KETONESUR NEGATIVE 11/27/2015 0859   PROTEINUR NEGATIVE 11/27/2015 0859   NITRITE NEGATIVE 11/27/2015 0859   LEUKOCYTESUR NEGATIVE 11/27/2015 0859   Recent Results (from the past 240 hour(s))  Blood Culture (routine x 2)     Status: None (Preliminary result)   Collection Time: 07/08/19 10:33 AM   Specimen: BLOOD  Result Value Ref Range Status   Specimen Description   Final    BLOOD RIGHT ARM Performed at [facility], [address] [place], [place] [address]    Special Requests   Final    BOTTLES DRAWN AEROBIC AND ANAEROBIC Blood Culture adequate volume Performed at [facility], [address] [place], [place] [address]    Culture   Final    NO GROWTH 4 DAYS Performed at [facility], [address] [place], [place] [address]    Report Status PENDING  Incomplete  Blood Culture (routine x  2)     Status: None (Preliminary result)   Collection Time: 07/09/19  4:04 AM   Specimen: BLOOD  Result Value Ref Range Status   Specimen Description   Final    BLOOD RIGHT AC Performed at [facility], [address] [place], [place] [address]    Special Requests   Final    BOTTLES DRAWN AEROBIC AND ANAEROBIC Blood Culture adequate volume Performed at [facility], [address] [place], [place] [address]    Culture   Final    NO GROWTH 3 DAYS Performed at [facility], [address] [place], [place] [address]    Report Status PENDING  Incomplete  Radiology Studies: No results found.  Scheduled Meds: . aspirin EC  81 mg Oral Daily  . diltiazem  240 mg Oral Daily  . enoxaparin (LOVENOX) injection  1 mg/kg Subcutaneous BID  . insulin aspart  0-5 Units Subcutaneous QHS  . insulin aspart  0-9 Units Subcutaneous TID WC  . methylPREDNISolone (SOLU-MEDROL) injection  60 mg Intravenous Q12H  . metoprolol tartrate  50 mg Oral BID  . pantoprazole  40 mg Oral Daily  . vitamin C  500 mg Oral Daily  . zinc sulfate  220 mg Oral Daily   Continuous Infusions: . sodium chloride Stopped (07/09/19 0515)     LOS: 5 days   Time spent: 25 minutes.  Tyrone Nine, MD [facility] [email or web address] 07/13/2019, 8:20 AM

## 2019-07-14 DIAGNOSIS — E669 Obesity, unspecified: Secondary | ICD-10-CM

## 2019-07-14 DIAGNOSIS — R778 Other specified abnormalities of plasma proteins: Secondary | ICD-10-CM

## 2019-07-14 DIAGNOSIS — R112 Nausea with vomiting, unspecified: Secondary | ICD-10-CM

## 2019-07-14 DIAGNOSIS — K5900 Constipation, unspecified: Secondary | ICD-10-CM

## 2019-07-14 DIAGNOSIS — R7989 Other specified abnormal findings of blood chemistry: Secondary | ICD-10-CM

## 2019-07-14 DIAGNOSIS — J9601 Acute respiratory failure with hypoxia: Secondary | ICD-10-CM

## 2019-07-14 LAB — GLUCOSE, CAPILLARY
Glucose-Capillary: 155 mg/dL — ABNORMAL HIGH (ref 70–99)
Glucose-Capillary: 234 mg/dL — ABNORMAL HIGH (ref 70–99)
Glucose-Capillary: 232 mg/dL — ABNORMAL HIGH (ref 70–99)

## 2019-07-14 LAB — CULTURE, BLOOD (ROUTINE X 2)
Culture: NO GROWTH
Special Requests: ADEQUATE

## 2019-07-14 MED ORDER — DEXAMETHASONE 6 MG PO TABS
6.0000 mg | ORAL_TABLET | Freq: Every day | ORAL | Status: DC
  Administered 2019-07-14 – 2019-07-15 (×2): 6 mg via ORAL
  Filled 2019-07-14 (×2): qty 1

## 2019-07-14 NOTE — Progress Notes (Signed)
[facility] PROGRESS NOTE    Progress Note  Terry Delgado  ZOX:[id number] DOB: 06/16/1966 DOA: 07/08/2019 PCP: Laurann Montana, MD     Brief Narrative:   Terry Delgado is an 53 y.o. male past medical history of atrial fibrillation status post ablation on 04/02/2019 not on anticoagulant presented with fever cough and shortness of breath he relates he was tested, today for SARS-CoV-2 on 06/30/2019 symptom worsen over the last 5 days prior to admission with shortness of breath hematemesis few days ago and diarrhea, but he denies nausea vomiting.  Assessment/Plan:   Acute respiratory failure with hypoxia due to COVID-19 viral pneumonia call CTA of the chest on 07/08/2019 showed multifocal airspace disease with extensive bilateral infiltrates no evidence of lymphadenopathy and a right lower lobe nodule in the thyroid. Previous course of IV remdesivir from 07/07/2021 07/12/2019. He has been continued onsteroid, will need to be tapered over the next week. Inflammatory markers worse extremely elevated on admission though they are slowly coming down. Due to possible superimposed bacterial pneumonia elevated procalcitonin and leukocytosis on admission he completed a 5-day course of Levaquin blood cultures have been negative till date. He is currently on therapeutic Lovenox for paroxysmal atrial fibrillation. Try To keep the patient prone for at least 16 hours a day. He is currently satting 94% on 2 L of oxygen. Ambulate and check saturations.  AF (paroxysmal atrial fibrillation) (HCC) On admission he had RVR, now in sinus rhythm his and since his hypoxia has improved. He is currently on diltiazem 240 and metoprolol 50, he seems to be normotensive.  And has converted back to sinus rhythm. 2D echo showed normal right ventricular systolic function and EF of 60% without diastolic dysfunction.  Elevated troponins: In the setting of hypoxia and RVR likely demand ischemia no wall motion on  echocardiogram.  Evaded LFT's: Letter to COVID-19. They are trending down.  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  Patient has no obstipation having regular bowel movements continue Senokot.  Prediabetes mellitus with an A1c of 6.3: Now on steroids we will continue long-acting insulin plus sliding scale.   DVT prophylaxis: lovenox Family Communication:none Disposition Plan/Barrier to D/C: ome in 2 days Code Status:     Code Status Orders  (From admission, onward)         Start     Ordered   07/08/19 2233  Full code  Continuous     07/08/19 2232        Code Status History    Date Active Date Inactive Code Status Order ID Comments User Context   11/27/2015 1242 12/02/2015 1310 Full Code [id number]  Sherrie George, PA-C ED   Advance Care Planning Activity        IV Access:    Peripheral IV   Procedures and diagnostic studies:   No results found.   Medical Consultants:    None.  Anti-Infectives:   None  Subjective:    [name] relates his breathing continue to improve on a daily basisis  Objective:    Vitals:   07/13/19 2004 07/13/19 2341 07/14/19 0437 07/14/19 0733  BP: 132/76 132/88 113/79 135/82  Pulse: 72 68 81 75  Resp: 17 16 16 20   Temp: 98.9 F (37.2 C) 97.8 F (36.6 C) 97.9 F (36.6 C) 98.9 F (37.2 C)  TempSrc: Oral Oral Oral Oral  SpO2: 96% 96% 95% 96%  Weight:      Height:       SpO2:  96 % O2 Flow Rate (L/min): 2 L/min   Intake/Output Summary (Last 24 hours) at 07/14/2019 0825 Last data filed at 07/14/2019 0734 Gross per 24 hour  Intake 840 ml  Output 1800 ml  Net -960 ml   Filed Weights   07/09/19 0300  Weight: 118.1 kg    Exam: General exam: In no acute distress. Respiratory system: Good air movement and clear to auscultation. Cardiovascular system: S1 & S2 heard, RRR. No JVD. Gastrointestinal system: Abdomen is nondistended, soft and nontender.   Central nervous system: Alert and oriented. No focal neurological deficits. Extremities: No pedal edema. Skin: No rashes, lesions or ulcers Psychiatry: Judgement and insight appear normal. Mood & affect appropriate.    Data Reviewed:    Labs: Basic Metabolic Panel: Recent Labs  Lab 07/09/19 0404 07/10/19 0257 07/11/19 0129 07/12/19 0355 07/13/19 0440  NA 138 137 140 139 138  K 4.5 4.8 4.6 4.8 4.7  CL 106 106 109 105 107  CO2 24 24 23 24 24   GLUCOSE 151* 196* 171* 151* 179*  BUN 13 25* 28* 26* 24*  CREATININE 1.13 1.14 1.03 1.03 1.10  CALCIUM 8.1* 8.3* 8.4* 8.5* 8.2*  MG 2.0  --   --   --   --   PHOS 3.2  --   --   --   --    GFR Estimated Creatinine Clearance: 104.2 mL/min (by C-G formula based on SCr of 1.1 mg/dL). Liver Function Tests: Recent Labs  Lab 07/09/19 0404 07/10/19 0257 07/11/19 0129 07/12/19 0355 07/13/19 0440  AST 58* 44* 35 57* 56*  ALT 78* 64* 59* 108* 144*  ALKPHOS 96 92 80 82 78  BILITOT 0.9 0.4 0.4 1.2 0.7  PROT 7.3 7.1 6.7 7.1 6.3*  ALBUMIN 3.0* 3.0* 2.7* 3.1* 2.8*   Recent Labs  Lab 07/08/19 1033  LIPASE 27   No results for input(s): AMMONIA in the last 168 hours. Coagulation profile Recent Labs  Lab 07/09/19 0404  INR 1.2   COVID-19 Labs  Recent Labs    07/12/19 0355 07/13/19 0440  DDIMER 0.79* 0.68*  CRP 8.6* 5.4*    Lab Results  Component Value Date   SARSCOV2NAA Detected (A) 06/30/2019    CBC: Recent Labs  Lab 07/09/19 0404 07/10/19 0257 07/11/19 0129 07/12/19 0355 07/13/19 0440  WBC 17.1* 14.5* 14.1* 13.4* 14.4*  NEUTROABS 15.2* 12.3* 11.9* 11.2* 11.9*  HGB 13.1 12.2* 12.4* 14.4 13.9  HCT 40.7 38.8* 38.9* 45.1 43.5  MCV 85.3 86.2 85.7 85.7 85.1  PLT 158 193 243 256 269   Cardiac Enzymes: No results for input(s): CKTOTAL, CKMB, CKMBINDEX, TROPONINI in the last 168 hours. BNP (last 3 results) No results for input(s): PROBNP in the last 8760 hours. CBG: Recent Labs  Lab 07/12/19 1614 07/13/19 0728  07/13/19 1141 07/13/19 1634 07/13/19 2054  GLUCAP 164* 162* 176* 229* 225*   D-Dimer: Recent Labs    07/12/19 0355 07/13/19 0440  DDIMER 0.79* 0.68*   Hgb A1c: No results for input(s): HGBA1C in the last 72 hours. Lipid Profile: No results for input(s): CHOL, HDL, LDLCALC, TRIG, CHOLHDL, LDLDIRECT in the last 72 hours. Thyroid function studies: No results for input(s): TSH, T4TOTAL, T3FREE, THYROIDAB in the last 72 hours.  Invalid input(s): FREET3 Anemia work up: No results for input(s): VITAMINB12, FOLATE, FERRITIN, TIBC, IRON, RETICCTPCT in the last 72 hours. Sepsis Labs: Recent Labs  Lab 07/08/19 1033  07/10/19 0257 07/11/19 0129 07/12/19 0355 07/13/19 0440  PROCALCITON 0.37  --   --   --   --   --  WBC 11.5*   < > 14.5* 14.1* 13.4* 14.4*  LATICACIDVEN 1.3  --   --   --   --   --    < > = values in this interval not displayed.   Microbiology Recent Results (from the past 240 hour(s))  Blood Culture (routine x 2)     Status: None   Collection Time: 07/08/19 10:33 AM   Specimen: BLOOD  Result Value Ref Range Status   Specimen Description   Final    BLOOD RIGHT ARM Performed at [facility], [address] [place], [place] [address]    Special Requests   Final    BOTTLES DRAWN AEROBIC AND ANAEROBIC Blood Culture adequate volume Performed at [facility], [address] [place], [place] [address]    Culture   Final    NO GROWTH 5 DAYS Performed at [facility], [address] [place], [place] [address]    Report Status 07/13/2019 FINAL  Final  Blood Culture (routine x 2)     Status: None (Preliminary result)   Collection Time: 07/09/19  4:04 AM   Specimen: BLOOD  Result Value Ref Range Status   Specimen Description   Final    BLOOD RIGHT AC Performed at [facility], [address] [place], [place] [address]    Special Requests   Final    BOTTLES DRAWN AEROBIC AND ANAEROBIC Blood Culture  adequate volume Performed at [facility], [address] [place], [place] [address]    Culture   Final    NO GROWTH 4 DAYS Performed at [facility], [address] [place], [place] [address]    Report Status PENDING  Incomplete     Medications:   . aspirin EC  81 mg Oral Daily  . diltiazem  240 mg Oral Daily  . enoxaparin (LOVENOX) injection  1 mg/kg Subcutaneous BID  . insulin aspart  0-5 Units Subcutaneous QHS  . insulin aspart  0-9 Units Subcutaneous TID WC  . methylPREDNISolone (SOLU-MEDROL) injection  60 mg Intravenous Q12H  . metoprolol tartrate  50 mg Oral BID  . pantoprazole  40 mg Oral Daily  . senna-docusate  1 tablet Oral BID  . vitamin C  500 mg Oral Daily  . zinc sulfate  220 mg Oral Daily   Continuous Infusions: . sodium chloride Stopped (07/09/19 0515)      LOS: 6 days   Marinda ElkAbraham Feliz Ortiz  [facility]  07/14/2019, 8:25 AM

## 2019-07-14 NOTE — Progress Notes (Signed)
Occupational Therapy Evaluation Patient Details Name: Terry Delgado MRN: [id number] DOB: 05-28-1966 Today's Date: 07/14/2019    History of Present Illness 53 y.o. male past medical history of atrial fibrillation status post ablation on 04/02/2019 not on anticoagulant presented with fever cough and shortness of breath he relates he was tested, today for SARS-CoV-2 on 06/30/2019 symptom worsen over the last 5 days prior to admission with shortness of breath hematemesis few days ago and diarrhea, but he denies nausea vomiting.   Clinical Impression   PTA, pt independent and worked at [employer]. Pt ableto ambulate @ 400 ft on RA with SpO2 remaining above 90. 1/4 DIE. Completed incentive spirometer (able to pull 750 ml) and flutter valve x 10 each. Encouraged pt to prone when in bed. Will follow up to complete education on T-band HEP and activity modification. Pt states his wife is Covid + and at [facility].     Follow Up Recommendations  No OT follow up    Equipment Recommendations  None recommended by OT    Recommendations for Other Services       Precautions / Restrictions        Mobility Bed Mobility Overal bed mobility: Independent                Transfers Overall transfer level: Needs assistance   Transfers: Sit to/from Stand Sit to Stand: Supervision              Balance Overall balance assessment: No apparent balance deficits (not formally assessed)                                         ADL either performed or assessed with clinical judgement   ADL Overall ADL's : Needs assistance/impaired                                       General ADL Comments: Overall set up for ADL with SpO2 above 90 on RA. Initially light headed with standing. will benefit form educaiton on activity modificaiton during recovery     Vision         Perception     Praxis      Pertinent Vitals/Pain Pain Assessment: No/denies pain     Hand Dominance  Right   Extremity/Trunk Assessment Upper Extremity Assessment Upper Extremity Assessment: Overall WFL for tasks assessed   Lower Extremity Assessment Lower Extremity Assessment: Defer to PT evaluation   Cervical / Trunk Assessment Cervical / Trunk Assessment: Normal   Communication Communication Communication: No difficulties   Cognition Arousal/Alertness: Awake/alert Behavior During Therapy: WFL for tasks assessed/performed Overall Cognitive Status: Within Functional Limits for tasks assessed                                     General Comments       Exercises Exercises: Other exercises Other Exercises Other Exercises: incentive spirometer x 10 - pulls @ 750 ml Other Exercises: Flutter valve x 10 Other Exercises: began educaiton on theraband HEP   Shoulder Instructions      Home Living Family/patient expects to be discharged to:: Private residence Living Arrangements: Spouse/significant other Available Help at Discharge: Family;Available PRN/intermittently Type of Home: House Home Access: Stairs to enter [employer]  of Steps: 3   Home Layout: One level     Bathroom Shower/Tub: Producer, television/film/video: Standard Bathroom Accessibility: Yes How Accessible: Accessible via walker Home Equipment: None   Additional Comments: States he has access to  shower chair      Prior Functioning/Environment Level of Independence: Independent        Comments: works at [employer] Problem List: Decreased activity tolerance;Cardiopulmonary status limiting activity      OT Treatment/Interventions: Self-care/ADL training;Therapeutic exercise;Energy conservation;Therapeutic activities;DME and/or AE instruction;Patient/family education    OT Goals(Current goals can be found in the care plan section) Acute Rehab OT Goals Patient Stated Goal: to go home and get back to work OT Goal Formulation: With patient Time For Goal  Achievement: 07/28/19 Potential to Achieve Goals: Good  OT Frequency: Min 2X/week   Barriers to D/C:            Co-evaluation              AM-PAC OT "6 Clicks" Daily Activity     Outcome Measure Help from another person eating meals?: None Help from another person taking care of personal grooming?: None Help from another person toileting, which includes using toliet, bedpan, or urinal?: None Help from another person bathing (including washing, rinsing, drying)?: A Little Help from another person to put on and taking off regular upper body clothing?: A Little Help from another person to put on and taking off regular lower body clothing?: A Little 6 Click Score: 21   End of Session Nurse Communication: Mobility status  Activity Tolerance: Patient tolerated treatment well Patient left: in chair;with call bell/phone within reach  OT Visit Diagnosis: Muscle weakness (generalized) (M62.81)                Time: 1950-9326 OT Time Calculation (min): 29 min Charges:  OT General Charges $OT Visit: 1 Visit OT Evaluation $OT Eval Moderate Complexity: 1 Mod OT Treatments $Self Care/Home Management : 8-22 mins  Luisa Dago, OT/L   Acute OT Clinical Specialist Acute Rehabilitation Services Pager [phone] Office [phone]   [facility] 07/14/2019, 5:12 PM

## 2019-07-14 NOTE — Progress Notes (Signed)
Primary RN call patients spouse to give update. Left message on voicemail for wife to return call.

## 2019-07-14 NOTE — Progress Notes (Signed)
ANTICOAGULATION CONSULT NOTE - Follow Up Consult  Pharmacy Consult for Lovenox  Indication: atrial fibrillation  Allergies  Allergen Reactions  . Penicillins Rash    Has patient had a PCN reaction causing immediate rash, facial/tongue/throat swelling, SOB or lightheadedness with hypotension: unknown Has patient had a PCN reaction causing severe rash involving mucus membranes or skin necrosis: No Has patient had a PCN reaction that required hospitalization No Has patient had a PCN reaction occurring within the last 10 years: No If all of the above answers are "NO", then may proceed with Cephalosporin use.    Patient Measurements: Height: 6' (182.9 cm) Weight: 260 lb 5.8 oz (118.1 kg) IBW/kg (Calculated) : 77.6 Heparin Dosing Weight:  103 kg   Vital Signs: Temp: 98.6 F (37 C) (11/04 1146) Temp Source: Oral (11/04 1146) BP: 127/80 (11/04 1146) Pulse Rate: 72 (11/04 1146)  Labs: Recent Labs    07/12/19 0355 07/13/19 0440  HGB 14.4 13.9  HCT 45.1 43.5  PLT 256 269  CREATININE 1.03 1.10    Estimated Creatinine Clearance: 104.2 mL/min (by C-G formula based on SCr of 1.1 mg/dL).   Medical History: Past Medical History:  Diagnosis Date  . A-fib ([place])   . Allergy   . GERD (gastroesophageal reflux disease)   . Hypertension     Medications:  Scheduled:  . aspirin EC  81 mg Oral Daily  . dexamethasone  6 mg Oral Daily  . diltiazem  240 mg Oral Daily  . enoxaparin (LOVENOX) injection  1 mg/kg Subcutaneous BID  . insulin aspart  0-5 Units Subcutaneous QHS  . insulin aspart  0-9 Units Subcutaneous TID WC  . metoprolol tartrate  50 mg Oral BID  . pantoprazole  40 mg Oral Daily  . senna-docusate  1 tablet Oral BID  . vitamin C  500 mg Oral Daily  . zinc sulfate  220 mg Oral Daily    Assessment: 53 yo male admitted on 10/29 with COVID-19 and diagnosed with atrial fibrillation. Pt not on any blood thinners PTA. On aspirin 81 mg PO daily.   Pharmacy was consulted for IV  heparin but transitioned to treatment dose Lovenox on 07/10/19.  H/H remains stable/wnl.  SCr wnl and Crcl > 100 ml/min.  No bleeding or complications reported.   Goal of Therapy:  Anti-Xa level 0.6-1 units/ml 4hrs after LMWH dose given Monitor platelets by anticoagulation protocol: Yes   Plan:  -Continue Lovenox 120 mg twice daily  -Monitor CBC, renal fx, and s/s of bleeding  - Follow up plans for longterm oral anticoagulation when appropriate.  Gretta Arab PharmD, BCPS Clinical pharmacist phone 7am- 5pm: [phone] 07/14/2019 1:02 PM

## 2019-07-14 NOTE — Plan of Care (Signed)
  Problem: Education: Goal: Knowledge of risk factors and measures for prevention of condition will improve Outcome: Progressing   Problem: Coping: Goal: Psychosocial and spiritual needs will be supported Outcome: Progressing   Problem: Respiratory: Goal: Will maintain a patent airway Outcome: Progressing Goal: Complications related to the disease process, condition or treatment will be avoided or minimized Outcome: Progressing   Problem: Education: Goal: Knowledge of General Education information will improve Description: Including pain rating scale, medication(s)/side effects and non-pharmacologic comfort measures Outcome: Progressing   Problem: Health Behavior/Discharge Planning: Goal: Ability to manage health-related needs will improve Outcome: Progressing   Problem: Clinical Measurements: Goal: Ability to maintain clinical measurements within normal limits will improve Outcome: Progressing Goal: Will remain free from infection Outcome: Progressing Goal: Diagnostic test results will improve Outcome: Progressing Goal: Respiratory complications will improve Outcome: Progressing Goal: Cardiovascular complication will be avoided Outcome: Progressing   Problem: Activity: Goal: Risk for activity intolerance will decrease Outcome: Progressing   Problem: Nutrition: Goal: Adequate nutrition will be maintained Outcome: Progressing   Problem: Coping: Goal: Level of anxiety will decrease Outcome: Progressing   Problem: Elimination: Goal: Will not experience complications related to bowel motility Outcome: Progressing Goal: Will not experience complications related to urinary retention Outcome: Progressing   Problem: Pain Managment: Goal: General experience of comfort will improve Outcome: Progressing   Problem: Pain Managment: Goal: General experience of comfort will improve Outcome: Progressing   Problem: Safety: Goal: Ability to remain free from injury will  improve Outcome: Progressing   Problem: Skin Integrity: Goal: Risk for impaired skin integrity will decrease Outcome: Progressing

## 2019-07-15 LAB — GLUCOSE, CAPILLARY
Glucose-Capillary: 227 mg/dL — ABNORMAL HIGH (ref 70–99)
Glucose-Capillary: 151 mg/dL — ABNORMAL HIGH (ref 70–99)
Glucose-Capillary: 190 mg/dL — ABNORMAL HIGH (ref 70–99)
Glucose-Capillary: 282 mg/dL — ABNORMAL HIGH (ref 70–99)

## 2019-07-15 MED ORDER — DEXAMETHASONE 0.5 MG PO TABS
1.0000 mg | ORAL_TABLET | Freq: Every day | ORAL | Status: DC
  Filled 2019-07-15: qty 2

## 2019-07-15 NOTE — Progress Notes (Signed)
[facility] PROGRESS NOTE    Progress Note  Terry Delgado  ZOX:[id number] DOB: 1966/03/18 DOA: 07/08/2019 PCP: Laurann MontanaWhite, Cynthia, MD     Brief Narrative:   Terry Delgado is an 53 y.o. male past medical history of atrial fibrillation status post ablation on 04/02/2019 not on anticoagulant presented with fever cough and shortness of breath he relates he was tested, today for SARS-CoV-2 on 06/30/2019 symptom worsen over the last 5 days prior to admission with shortness of breath hematemesis few days ago and diarrhea, but he denies nausea vomiting.  Assessment/Plan:   Acute respiratory failure with hypoxia due to COVID-19 viral pneumonia call CTA of the chest on 07/08/2019 showed multifocal airspace disease with extensive bilateral infiltrates no evidence of lymphadenopathy and a right lower lobe nodule in the thyroid. Complete his course of IV remdesivir.   He is now off the oxygen and satting greater 94% on room air, he was ambulated by physical therapy and his saturations remained  above 92%.. We will continue steroids for an additional 24 hours. Due to the concern of superimposed bacterial pneumonia, as he has an elevated procalcitonin and leukocytosis he was treated with 5-day course of IV antibiotics blood cultures remain negative. Keep the patient peripherally 16 hours a day. We will check saturations with ambulation.  AF (paroxysmal atrial fibrillation) (HCC) On admission he was on RVR, now in sinus rhythm hypoxia has improved. He is current on diltiazem and metoprolol he has been normotensive. 2D echo showed no acute changes.  Elevated troponins: In the setting of hypoxia and RVR likely demand ischemia no wall motion on echocardiogram.  Evaded LFT's: Letter to COVID-19. They are trending down.  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  Patient has no obstipation having regular bowel movements continue Senokot.   Prediabetes mellitus with an A1c of 6.3: Now on steroids we will continue long-acting insulin plus sliding scale.   DVT prophylaxis: lovenox Family Communication:none Disposition Plan/Barrier to D/C: home in am Code Status:     Code Status Orders  (From admission, onward)         Start     Ordered   07/08/19 2233  Full code  Continuous     07/08/19 2232        Code Status History    Date Active Date Inactive Code Status Order ID Comments User Context   11/27/2015 1242 12/02/2015 1310 Full Code [id number]  Sherrie GeorgeJennings, Willard, PA-C ED   Advance Care Planning Activity        IV Access:    Peripheral IV   Procedures and diagnostic studies:   No results found.   Medical Consultants:    None.  Anti-Infectives:   None  Subjective:    Terry Delgado relates his breathing is back to baseline.  Objective:    Vitals:   07/14/19 1930 07/14/19 2108 07/15/19 0551 07/15/19 0744  BP: 105/69 113/70 118/79 (!) 106/57  Pulse:  95  74  Resp: 18  16 18   Temp: 98.7 F (37.1 C)  97.8 F (36.6 C) 98.3 F (36.8 C)  TempSrc: Oral  Oral Oral  SpO2:    96%  Weight:      Height:       SpO2: 96 % O2 Flow Rate (L/min): 1 L/min   Intake/Output Summary (Last 24 hours) at 07/15/2019 0819 Last data filed at 07/14/2019 1824 Gross per 24 hour  Intake 1200 ml  Output -  Net 1200 ml  Filed Weights   07/09/19 0300  Weight: 118.1 kg    Exam: General exam: In no acute distress. Respiratory system: Good air movement and diffuse crackles bilaterally. Cardiovascular system: S1 & S2 heard, RRR. No JVD. Gastrointestinal system: Abdomen is nondistended, soft and nontender.  Central nervous system: Alert and oriented. No focal neurological deficits. Extremities: No pedal edema. Skin: No rashes, lesions or ulcers Psychiatry: Judgement and insight appear normal. Mood & affect appropriate.    Data Reviewed:    Labs: Basic Metabolic Panel: Recent Labs  Lab 07/09/19 0404  07/10/19 0257 07/11/19 0129 07/12/19 0355 07/13/19 0440  NA 138 137 140 139 138  K 4.5 4.8 4.6 4.8 4.7  CL 106 106 109 105 107  CO2 24 24 23 24 24   GLUCOSE 151* 196* 171* 151* 179*  BUN 13 25* 28* 26* 24*  CREATININE 1.13 1.14 1.03 1.03 1.10  CALCIUM 8.1* 8.3* 8.4* 8.5* 8.2*  MG 2.0  --   --   --   --   PHOS 3.2  --   --   --   --    GFR Estimated Creatinine Clearance: 104.2 mL/min (by C-G formula based on SCr of 1.1 mg/dL). Liver Function Tests: Recent Labs  Lab 07/09/19 0404 07/10/19 0257 07/11/19 0129 07/12/19 0355 07/13/19 0440  AST 58* 44* 35 57* 56*  ALT 78* 64* 59* 108* 144*  ALKPHOS 96 92 80 82 78  BILITOT 0.9 0.4 0.4 1.2 0.7  PROT 7.3 7.1 6.7 7.1 6.3*  ALBUMIN 3.0* 3.0* 2.7* 3.1* 2.8*   Recent Labs  Lab 07/08/19 1033  LIPASE 27   No results for input(s): AMMONIA in the last 168 hours. Coagulation profile Recent Labs  Lab 07/09/19 0404  INR 1.2   COVID-19 Labs  Recent Labs    07/13/19 0440  DDIMER 0.68*  CRP 5.4*    Lab Results  Component Value Date   SARSCOV2NAA Detected (A) 06/30/2019    CBC: Recent Labs  Lab 07/09/19 0404 07/10/19 0257 07/11/19 0129 07/12/19 0355 07/13/19 0440  WBC 17.1* 14.5* 14.1* 13.4* 14.4*  NEUTROABS 15.2* 12.3* 11.9* 11.2* 11.9*  HGB 13.1 12.2* 12.4* 14.4 13.9  HCT 40.7 38.8* 38.9* 45.1 43.5  MCV 85.3 86.2 85.7 85.7 85.1  PLT 158 193 243 256 269   Cardiac Enzymes: No results for input(s): CKTOTAL, CKMB, CKMBINDEX, TROPONINI in the last 168 hours. BNP (last 3 results) No results for input(s): PROBNP in the last 8760 hours. CBG: Recent Labs  Lab 07/13/19 1634 07/13/19 2054 07/14/19 0730 07/14/19 1137 07/14/19 1618  GLUCAP 229* 225* 155* 234* 232*   D-Dimer: Recent Labs    07/13/19 0440  DDIMER 0.68*   Hgb A1c: No results for input(s): HGBA1C in the last 72 hours. Lipid Profile: No results for input(s): CHOL, HDL, LDLCALC, TRIG, CHOLHDL, LDLDIRECT in the last 72 hours. Thyroid function  studies: No results for input(s): TSH, T4TOTAL, T3FREE, THYROIDAB in the last 72 hours.  Invalid input(s): FREET3 Anemia work up: No results for input(s): VITAMINB12, FOLATE, FERRITIN, TIBC, IRON, RETICCTPCT in the last 72 hours. Sepsis Labs: Recent Labs  Lab 07/08/19 1033  07/10/19 0257 07/11/19 0129 07/12/19 0355 07/13/19 0440  PROCALCITON 0.37  --   --   --   --   --   WBC 11.5*   < > 14.5* 14.1* 13.4* 14.4*  LATICACIDVEN 1.3  --   --   --   --   --    < > = values in  this interval not displayed.   Microbiology Recent Results (from the past 240 hour(s))  Blood Culture (routine x 2)     Status: None   Collection Time: 07/08/19 10:33 AM   Specimen: BLOOD  Result Value Ref Range Status   Specimen Description   Final    BLOOD RIGHT ARM Performed at [facility] [address] [place], [place] [address]    Special Requests   Final    BOTTLES DRAWN AEROBIC AND ANAEROBIC Blood Culture adequate volume Performed at [facility] [address], [place], [place] [address]    Culture   Final    NO GROWTH 5 DAYS Performed at [facility], [place] [address], [place], [place] [address]    Report Status 07/13/2019 FINAL  Final  Blood Culture (routine x 2)     Status: None   Collection Time: 07/09/19  4:04 AM   Specimen: BLOOD  Result Value Ref Range Status   Specimen Description   Final    BLOOD RIGHT AC Performed at [facility] [address] [place], [place] [address]    Special Requests   Final    BOTTLES DRAWN AEROBIC AND ANAEROBIC Blood Culture adequate volume Performed at [facility] [address], [place], [place] [address]    Culture   Final    NO GROWTH 5 DAYS Performed at [facility], [place] [address], [place],  [address]    Report Status 07/14/2019 FINAL  Final     Medications:   . aspirin EC  81 mg Oral Daily  . dexamethasone  6 mg Oral Daily  . diltiazem   240 mg Oral Daily  . enoxaparin (LOVENOX) injection  1 mg/kg Subcutaneous BID  . insulin aspart  0-5 Units Subcutaneous QHS  . insulin aspart  0-9 Units Subcutaneous TID WC  . metoprolol tartrate  50 mg Oral BID  . pantoprazole  40 mg Oral Daily  . senna-docusate  1 tablet Oral BID  . vitamin C  500 mg Oral Daily  . zinc sulfate  220 mg Oral Daily   Continuous Infusions: . sodium chloride Stopped (07/09/19 0515)      LOS: 7 days   Charlynne Cousins  [facility]  07/15/2019, 8:19 AM

## 2019-07-15 NOTE — Evaluation (Signed)
Physical Therapy Evaluation Patient Details Name: Terry RuddyKevin Cirrincione MRN: [id number] DOB: 1965-11-15 Today's Date: 07/15/2019   History of Present Illness  53 y.o. male past medical history of atrial fibrillation status post ablation on 04/02/2019 not on anticoagulant presented with fever cough and shortness of breath he relates he was tested, today for SARS-CoV-2 on 06/30/2019 symptom worsen over the last 5 days prior to admission with shortness of breath hematemesis few days ago and diarrhea, but he denies nausea vomiting.  Clinical Impression   Pt admitted with above diagnosis. PTA was independent and working at [employer], lives with spouse, who is Engineer, civil (consulting)nurse. Spouse is also dx with COVID and currently hospitalized. Pt currently with functional limitations due to the deficits listed below (see PT Problem List). Pt is at mod I with all functional mobility this am, he was able to ambulate approx 47500ft with no AD and mod I with no balance or coordination deficits noted, pt was on room air entirety of assessment and was able to maintain sats in 90s, lowest noted sat was 91% At this time no further skilled acute care level PT intervention is indicated, pt has been cleared to ambulate in hall as able but has been instructed to notify nurse prior to doing so. Pt has been educated on use of flutter valve and also incentive spirometer.       Follow Up Recommendations No PT follow up    Equipment Recommendations  None recommended by PT    Recommendations for Other Services       Precautions / Restrictions Precautions Precautions: Fall Restrictions Weight Bearing Restrictions: No      Mobility  Bed Mobility               General bed mobility comments: up and moving around at therapist arrival  Transfers Overall transfer level: Independent   Transfers: Sit to/from Stand Sit to Stand: Independent         General transfer comment: from bed chair and commode  Ambulation/Gait Ambulation/Gait  assistance: Modified independent (Device/Increase time) Gait Distance (Feet): 400 Feet Assistive device: None Gait Pattern/deviations: Step-through pattern     General Gait Details: no significant deficits noted with ambulation, completed distance with mod I with sats in 90s on room air  Stairs            Wheelchair Mobility    Modified Rankin (Stroke Patients Only)       Balance Overall balance assessment: Modified Independent                               Standardized Balance Assessment Standardized Balance Assessment : Berg Balance Test Berg Balance Test Sit to Stand: Able to stand without using hands and stabilize independently Standing Unsupported: Able to stand safely 2 minutes Sitting with Back Unsupported but Feet Supported on Floor or Stool: Able to sit safely and securely 2 minutes Stand to Sit: Sits safely with minimal use of hands Transfers: Able to transfer safely, minor use of hands Standing Unsupported with Eyes Closed: Able to stand 10 seconds safely Standing Ubsupported with Feet Together: Able to place feet together independently and stand 1 minute safely From Standing, Reach Forward with Outstretched Arm: Can reach forward >12 cm safely (5") From Standing Position, Pick up Object from Floor: Able to pick up shoe, needs supervision From Standing Position, Turn to Look Behind Over each Shoulder: Looks behind from both sides and weight shifts well Turn  360 Degrees: Able to turn 360 degrees safely in 4 seconds or less Standing Unsupported, Alternately Place Feet on Step/Stool: Able to stand independently and complete 8 steps >20 seconds Standing Unsupported, One Foot in Front: Able to take small step independently and hold 30 seconds Standing on One Leg: Able to lift leg independently and hold equal to or more than 3 seconds Total Score: 49         Pertinent Vitals/Pain Pain Assessment: No/denies pain    Home Living Family/patient  expects to be discharged to:: Private residence Living Arrangements: Spouse/significant other Available Help at Discharge: Family;Available PRN/intermittently Type of Home: House Home Access: Stairs to enter   [employer] of Steps: 3 Home Layout: One level Home Equipment: None      Prior Function Level of Independence: Independent         Comments: works at [employer]: Right    Extremity/Trunk Assessment   Upper Extremity Assessment Upper Extremity Assessment: Overall WFL for tasks assessed    Lower Extremity Assessment Lower Extremity Assessment: Overall WFL for tasks assessed    Cervical / Trunk Assessment Cervical / Trunk Assessment: Normal  Communication   Communication: No difficulties  Cognition Arousal/Alertness: Awake/alert Behavior During Therapy: WFL for tasks assessed/performed Overall Cognitive Status: Within Functional Limits for tasks assessed                                 General Comments: arrived to find pt in rest room able to move about room independently on room air with sats in 90s      General Comments      Exercises Other Exercises Other Exercises: incentive spirometer x 10 - pulls @ 1000 ml Other Exercises: Flutter valve x 10   Assessment/Plan    PT Assessment Patent does not need any further PT services  PT Problem List         PT Treatment Interventions      PT Goals (Current goals can be found in the Care Plan section)  Acute Rehab PT Goals Patient Stated Goal: to go home and get back to work PT Goal Formulation: With patient    Frequency     Barriers to discharge        Co-evaluation               AM-PAC PT "6 Clicks" Mobility  Outcome Measure Help needed turning from your back to your side while in a flat bed without using bedrails?: None Help needed moving from lying on your back to sitting on the side of a flat bed without using bedrails?:  None Help needed moving to and from a bed to a chair (including a wheelchair)?: None Help needed standing up from a chair using your arms (e.g., wheelchair or bedside chair)?: None Help needed to walk in hospital room?: None Help needed climbing 3-5 steps with a railing? : A Little 6 Click Score: 23    End of Session   Activity Tolerance: Patient tolerated treatment well Patient left: Other (comment)(standing in room states he is going to stand for next 46min) Nurse Communication: Mobility status PT Visit Diagnosis: Muscle weakness (generalized) (M62.81)    Time: 1660-6301 PT Time Calculation (min) (ACUTE ONLY): 21 min   Charges:   PT Evaluation $PT Eval Low Complexity: 1 Low          Chancelor Hardrick  Verdell Carmine, PT   Freddi Starr 07/15/2019, 11:04 AM

## 2019-07-15 NOTE — Progress Notes (Signed)
Family Update  Spoke with patients wife on speaker phone while in the patients room. Updated her on  his current status/POC and d/c plan.

## 2019-07-16 LAB — GLUCOSE, CAPILLARY
Glucose-Capillary: 219 mg/dL — ABNORMAL HIGH (ref 70–99)
Glucose-Capillary: 141 mg/dL — ABNORMAL HIGH (ref 70–99)
Glucose-Capillary: 108 mg/dL — ABNORMAL HIGH (ref 70–99)

## 2019-07-16 MED ORDER — METFORMIN HCL 500 MG PO TABS
500.0000 mg | ORAL_TABLET | Freq: Every day | ORAL | Status: DC
  Administered 2019-07-16: 500 mg via ORAL
  Filled 2019-07-16: qty 1

## 2019-07-16 MED ORDER — METOPROLOL TARTRATE 25 MG PO TABS
12.5000 mg | ORAL_TABLET | Freq: Two times a day (BID) | ORAL | Status: DC
  Administered 2019-07-16: 12.5 mg via ORAL
  Filled 2019-07-16 (×2): qty 1

## 2019-07-16 MED ORDER — METOPROLOL TARTRATE 50 MG PO TABS: 50.0000 mg | ORAL_TABLET | Freq: Two times a day (BID) | ORAL | 2 refills | Status: DC

## 2019-07-16 MED ORDER — DILTIAZEM HCL ER COATED BEADS 240 MG PO CP24: 240.0000 mg | ORAL_CAPSULE | Freq: Every day | ORAL | 2 refills | Status: DC

## 2019-07-16 MED ORDER — RIVAROXABAN 20 MG PO TABS: 20.0000 mg | ORAL_TABLET | Freq: Every day | ORAL | 0 refills | Status: DC

## 2019-07-16 MED ORDER — METOPROLOL TARTRATE 25 MG PO TABS: 12.5000 mg | ORAL_TABLET | Freq: Two times a day (BID) | ORAL | 0 refills | Status: DC

## 2019-07-16 NOTE — Plan of Care (Signed)
  Problem: Education: Goal: Knowledge of risk factors and measures for prevention of condition will improve Outcome: Adequate for Discharge   

## 2019-07-16 NOTE — Discharge Instructions (Signed)

## 2019-07-16 NOTE — Progress Notes (Signed)
Patient is being discharged today. Patient has been talking to wife about discharge plans. [facility] is getting [facility] and will come pick up husband at 3pm

## 2019-07-16 NOTE — Plan of Care (Signed)
  Problem: Education: Goal: Knowledge of risk factors and measures for prevention of condition will improve 07/16/2019 1446 by Roma Kayser, RN Outcome: Adequate for Discharge 07/16/2019 1100 by Roma Kayser, RN Outcome: Adequate for Discharge

## 2019-07-16 NOTE — Discharge Summary (Signed)
Physician Discharge Summary  Terry Delgado GGY:[id number] DOB: 27-Jan-1966 DOA: 07/08/2019  PCP: Harlan Stains, MD  Admit date: 07/08/2019 Discharge date: 07/16/2019  Admitted From: home Disposition:  Home  Recommendations for Outpatient Follow-up:  1. Follow up with PCP in 1-2 weeks follow-up on thyroid nodule for further work-up. 2. Please obtain BMP/CBC in one week 3.   Home Health:no Equipment/Devices:none  Discharge Condition:Stable CODE STATUS:Full Diet recommendation: Heart Healthy   Brief/Interim Summary: 53 y.o. male past medical history of atrial fibrillation status post ablation on 04/02/2019 not on anticoagulant presented with fever cough and shortness of breath he relates he was tested, today for SARS-CoV-2 on 06/30/2019 symptom worsen over the last 5 days prior to admission with shortness of breath hematemesis few days ago and diarrhea, but he denies nausea vomiting.  Discharge Diagnoses:  Acute respiratory failure with hypoxia due to COVID-19 viral PNA: CT of the chest on admission showed multifocal airspace disease with extensive bilateral infiltrate and lymphadenopathy and a thyroid nodule. He was started on IV remdesivir and steroids for which he completed his treatment in house. On admission he was requiring 15 L on a nonrebreather which he slowly improve until he was weaned off oxygen 24 hours prior to discharge. On admission he also had an elevation of his procalcitonin and a leukocytosis and he was treated with a 5-day course of IV antibiotics.  AF (paroxysmal atrial fibrillation) ([place]) With RVR on admission he was treated with metoprolol and diltiazem and he remained normotensive. 2D echo showed no acute changes. He will go home on diltiazem and metoprolol. He remains in atrial fibrillation for more than 48 hours so he was started on Lovenox, this was subsequently changed to oral Xarelto which she will continue as an outpatient and follow-up with cardiology  done.  Elevated LFTs: Likely due to COVID-19 now resolved.  Constipation/intestinal malrotation:  Prediabetes mellitus with an A1c of 6.3: His blood glucose was slightly likely due to steroids in house he was covered with long-acting insulin and sliding scale he will go home on no oral hypoglycemic agents   Discharge Instructions  Discharge Instructions    Diet - low sodium heart healthy   Complete by: As directed    Increase activity slowly   Complete by: As directed    MyChart COVID-19 home monitoring program   Complete by: Jul 16, 2019    Is the patient willing to use the [place] for home monitoring?: Yes   Temperature monitoring   Complete by: Jul 16, 2019    After how many days would you like to receive a notification of this patient's flowsheet entries?: 1     Allergies as of 07/16/2019      Reactions   Penicillins Rash   Has patient had a PCN reaction causing immediate rash, facial/tongue/throat swelling, SOB or lightheadedness with hypotension: unknown Has patient had a PCN reaction causing severe rash involving mucus membranes or skin necrosis: No Has patient had a PCN reaction that required hospitalization No Has patient had a PCN reaction occurring within the last 10 years: No If all of the above answers are "NO", then may proceed with Cephalosporin use.      Medication List    TAKE these medications   aspirin EC 81 MG tablet Take 81 mg by mouth daily.   diltiazem 240 MG 24 hr capsule Commonly known as: CARDIZEM CD Take 1 capsule (240 mg total) by mouth daily.   esomeprazole 20 MG capsule Commonly known as:  NEXIUM Take 20 mg by mouth daily.   fexofenadine 30 MG tablet Commonly known as: ALLEGRA Take 30 mg by mouth daily as needed (seasonal allergies). Reported on 11/27/2015   fluticasone 50 MCG/ACT nasal spray Commonly known as: FLONASE Place 1 spray into both nostrils daily as needed for allergies or rhinitis.   ibuprofen 200 MG  tablet Commonly known as: ADVIL Take 400 mg by mouth daily as needed for mild pain.   metoprolol tartrate 25 MG tablet Commonly known as: LOPRESSOR Take 0.5 tablets (12.5 mg total) by mouth 2 (two) times daily.   rivaroxaban 20 MG Tabs tablet Commonly known as: XARELTO Take 1 tablet (20 mg total) by mouth daily with supper.       Allergies  Allergen Reactions  . Penicillins Rash    Has patient had a PCN reaction causing immediate rash, facial/tongue/throat swelling, SOB or lightheadedness with hypotension: unknown Has patient had a PCN reaction causing severe rash involving mucus membranes or skin necrosis: No Has patient had a PCN reaction that required hospitalization No Has patient had a PCN reaction occurring within the last 10 years: No If all of the above answers are "NO", then may proceed with Cephalosporin use.    Consultations:  None   Procedures/Studies: Ct Angio Chest Pe W And/or Wo Contrast  Result Date: 07/08/2019 CLINICAL DATA:  Shortness of breath. COVID-19 positive. Abdominal pain and diarrhea EXAM: CT ANGIOGRAPHY CHEST CT ABDOMEN AND PELVIS WITH CONTRAST TECHNIQUE: Multidetector CT imaging of the chest was performed using the standard protocol during bolus administration of intravenous contrast. Multiplanar CT image reconstructions and MIPs were obtained to evaluate the vascular anatomy. Multidetector CT imaging of the abdomen and pelvis was performed using the standard protocol during bolus administration of intravenous contrast. CONTRAST:  OMNIPAQUE IOHEXOL 350 MG/ML SOLN COMPARISON:  Chest radiograph July 08, 2019; CT abdomen and pelvis November 27, 2015 FINDINGS: CTA CHEST FINDINGS Cardiovascular: There is no demonstrable pulmonary embolus. There is no thoracic aortic aneurysm or dissection. The visualized great vessels appear normal. There is no pericardial effusion or pericardial thickening. Mediastinum/Nodes: There is a nodular opacity in the right lobe  of the thyroid measuring 1.8 x 1.5 cm. There is no demonstrable thoracic adenopathy by size criteria. There are occasional subcentimeter mediastinal lymph nodes. No esophageal lesions are evident. Lungs/Pleura: There is extensive airspace opacity throughout the lungs diffusely with the greatest concentration of airspace opacity in both lower lobes. The appearance of the airspace disease in the upper lobes and in the right middle lobe is consistent with ground-glass type opacity. There is a mixture ground-glass opacity and relative consolidation in both lower lobes and in the inferior lingula. There are no appreciable pleural effusions. Musculoskeletal: No blastic or lytic bone lesions are evident. No chest wall lesions. Review of the MIP images confirms the above findings. CT ABDOMEN and PELVIS FINDINGS Hepatobiliary: No focal liver lesions are evident. Gallbladder wall is not appreciably thickened. There is no biliary duct dilatation. Pancreas: No pancreatic mass or inflammatory focus. Spleen: No splenic lesions are evident. Adrenals/Urinary Tract: Adrenals bilaterally appear normal. There is a 7 mm cyst in the upper pole of the left kidney. There is a 6 mm cyst in the lower pole left kidney. There is no evident hydronephrosis on either side. There is no appreciable renal or ureteral calculus on either side. Urinary bladder is midline with wall thickness within normal limits. Stomach/Bowel: There is no appreciable bowel wall or mesenteric thickening. Note that there is a  degree of malrotation. Duodenum does not pass between the superior mesenteric artery and aorta. The cecum is located in the mid abdomen. No evident bowel obstruction. Terminal ileum appears unremarkable. There is no evident free air or portal venous air. Vascular/Lymphatic: No abdominal aortic aneurysm. No vascular lesions evident. No adenopathy appreciable in the abdomen or pelvis. Reproductive: There are prostatic calculi. Prostate and seminal  vesicles are normal in size and contour. No evident pelvic mass. Other: No periappendiceal region inflammatory change. No abscess or ascites in the abdomen or pelvis. There is mild fat in the umbilicus. Musculoskeletal: No blastic or lytic bone lesions evident. No intramuscular or abdominal wall lesions. Review of the MIP images confirms the above findings. IMPRESSION: CT angiogram chest: 1. No demonstrable pulmonary embolus. No thoracic aortic aneurysm or dissection. 2. Multifocal airspace disease consistent with extensive bilateral multifocal pneumonia. Mixture of consolidation and ground-glass type opacities. 3. No evident adenopathy by size criteria. 4. Nodule in the right lobe of the thyroid measuring 1.8 x 1.5 cm. Nonemergent thyroid ultrasound advised given this finding. 5. CT abdomen and pelvis: 1. 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 No bowel dilatation or bowel obstruction. No abscess in the abdomen or pelvis. No internal hernia evident. 2. No evident renal or ureteral calculus. No hydronephrosis. Urinary bladder wall thickness normal. There are prostatic calculi. Electronically Signed   By: Bretta Bang III M.D.   On: 07/08/2019 14:22   Ct Abdomen Pelvis W Contrast  Result Date: 07/08/2019 CLINICAL DATA:  Shortness of breath. COVID-19 positive. Abdominal pain and diarrhea EXAM: CT ANGIOGRAPHY CHEST CT ABDOMEN AND PELVIS WITH CONTRAST TECHNIQUE: Multidetector CT imaging of the chest was performed using the standard protocol during bolus administration of intravenous contrast. Multiplanar CT image reconstructions and MIPs were obtained to evaluate the vascular anatomy. Multidetector CT imaging of the abdomen and pelvis was performed using the standard protocol during bolus administration of intravenous contrast. CONTRAST:  OMNIPAQUE IOHEXOL 350 MG/ML SOLN COMPARISON:  Chest radiograph July 08, 2019; CT abdomen and pelvis November 27, 2015 FINDINGS: CTA CHEST FINDINGS Cardiovascular: There  is no demonstrable pulmonary embolus. There is no thoracic aortic aneurysm or dissection. The visualized great vessels appear normal. There is no pericardial effusion or pericardial thickening. Mediastinum/Nodes: There is a nodular opacity in the right lobe of the thyroid measuring 1.8 x 1.5 cm. There is no demonstrable thoracic adenopathy by size criteria. There are occasional subcentimeter mediastinal lymph nodes. No esophageal lesions are evident. Lungs/Pleura: There is extensive airspace opacity throughout the lungs diffusely with the greatest concentration of airspace opacity in both lower lobes. The appearance of the airspace disease in the upper lobes and in the right middle lobe is consistent with ground-glass type opacity. There is a mixture ground-glass opacity and relative consolidation in both lower lobes and in the inferior lingula. There are no appreciable pleural effusions. Musculoskeletal: No blastic or lytic bone lesions are evident. No chest wall lesions. Review of the MIP images confirms the above findings. CT ABDOMEN and PELVIS FINDINGS Hepatobiliary: No focal liver lesions are evident. Gallbladder wall is not appreciably thickened. There is no biliary duct dilatation. Pancreas: No pancreatic mass or inflammatory focus. Spleen: No splenic lesions are evident. Adrenals/Urinary Tract: Adrenals bilaterally appear normal. There is a 7 mm cyst in the upper pole of the left kidney. There is a 6 mm cyst in the lower pole left kidney. There is no evident hydronephrosis on either side. There is no appreciable renal or ureteral calculus on  either side. Urinary bladder is midline with wall thickness within normal limits. Stomach/Bowel: There is no appreciable bowel wall or mesenteric thickening. 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 Duodenum does not pass between the superior mesenteric artery and aorta. The cecum is located in the mid abdomen. No evident bowel obstruction. Terminal ileum appears  unremarkable. There is no evident free air or portal venous air. Vascular/Lymphatic: No abdominal aortic aneurysm. No vascular lesions evident. No adenopathy appreciable in the abdomen or pelvis. Reproductive: There are prostatic calculi. Prostate and seminal vesicles are normal in size and contour. No evident pelvic mass. Other: No periappendiceal region inflammatory change. No abscess or ascites in the abdomen or pelvis. There is mild fat in the umbilicus. Musculoskeletal: No blastic or lytic bone lesions evident. No intramuscular or abdominal wall lesions. Review of the MIP images confirms the above findings. IMPRESSION: CT angiogram chest: 1. No demonstrable pulmonary embolus. No thoracic aortic aneurysm or dissection. 2. Multifocal airspace disease consistent with extensive bilateral multifocal pneumonia. Mixture of consolidation and ground-glass type opacities. 3. No evident adenopathy by size criteria. 4. Nodule in the right lobe of the thyroid measuring 1.8 x 1.5 cm. Nonemergent thyroid ultrasound advised given this finding. 5. CT abdomen and pelvis: 1. 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 No bowel dilatation or bowel obstruction. No abscess in the abdomen or pelvis. No internal hernia evident. 2. No evident renal or ureteral calculus. No hydronephrosis. Urinary bladder wall thickness normal. There are prostatic calculi. Electronically Signed   By: Bretta Bang III M.D.   On: 07/08/2019 14:22   Dg Chest Port 1 View  Result Date: 07/08/2019 CLINICAL DATA:  Shortness of breath, COVID-19 positive EXAM: PORTABLE CHEST 1 VIEW COMPARISON:  04/13/2018 FINDINGS: Heart size is mildly enlarged. Patchy bilateral airspace opacities in a predominantly perihilar and bibasilar distribution. Lung volumes are low. No pleural effusion or pneumothorax. IMPRESSION: Patchy bilateral airspace opacities suspicious for multifocal pneumonia. Electronically Signed   By: Duanne Guess M.D.   On: 07/08/2019 11:05     Subjective: No complaints feels great.  Discharge Exam: Vitals:   07/15/19 2119 07/16/19 0500  BP:  114/74  Pulse: 72 66  Resp:  18  Temp:  98.4 F (36.9 C)  SpO2:     Vitals:   07/15/19 1652 07/15/19 2000 07/15/19 2119 07/16/19 0500  BP: 116/74 112/73  114/74  Pulse: 69  72 66  Resp: Temp: 98.2 F (36.8 C) 98.5 F (36.9 C)  98.4 F (36.9 C)  TempSrc: Oral Oral  Oral  SpO2: 96%     Weight:      Height:        General: Pt is alert, awake, not in acute distress Cardiovascular: RRR, S1/S2 +, no rubs, no gallops Respiratory: CTA bilaterally, no wheezing, no rhonchi Abdominal: Soft, NT, ND, bowel sounds + Extremities: no edema, no cyanosis    The results of significant diagnostics from this hospitalization (including imaging, microbiology, ancillary and laboratory) are listed below for reference.     Microbiology: Recent Results (from the past 240 hour(s))  Blood Culture (routine x 2)     Status: None   Collection Time: 07/08/19 10:33 AM   Specimen: BLOOD  Result Value Ref Range Status   Specimen Description   Final    BLOOD RIGHT ARM Performed at [facility], [address] [place], [place] [address]    Special Requests   Final    BOTTLES  DRAWN AEROBIC AND ANAEROBIC Blood Culture adequate volume Performed at [facility], [address] [place], [place] [address]    Culture   Final    NO GROWTH 5 DAYS Performed at [facility], [address], [place], [place] [address]    Report Status 07/13/2019 FINAL  Final  Blood Culture (routine x 2)     Status: None   Collection Time: 07/09/19  4:04 AM   Specimen: BLOOD  Result Value Ref Range Status   Specimen Description   Final    BLOOD RIGHT AC Performed at [facility], [address], [place], [place] [address]    Special Requests   Final    BOTTLES DRAWN AEROBIC AND ANAEROBIC Blood Culture adequate volume Performed at [facility], [address], [place], [place] [address]    Culture   Final    NO GROWTH 5 DAYS Performed at [facility], [address] [place], [place] [address]    Report Status 07/14/2019 FINAL  Final     Labs: BNP (last 3 results) Recent Labs    07/09/19 0404  BNP 111.0*   Basic Metabolic Panel: Recent Labs  Lab 07/10/19 0257 07/11/19 0129 07/12/19 0355 07/13/19 0440  NA 137 140 139 138  K 4.8 4.6 4.8 4.7  CL 106 109 105 107  CO2 GLUCOSE 196* 171* 151* 179*  BUN 25* 28* 26* 24*  CREATININE 1.14 1.03 1.03 1.10  CALCIUM 8.3* 8.4* 8.5* 8.2*   Liver Function Tests: Recent Labs  Lab 07/10/19 0257 07/11/19 0129 07/12/19 0355 07/13/19 0440  AST 44* 35 57* 56*  ALT 64* 59* 108* 144*  ALKPHOS 92 80 82 78  BILITOT 0.4 0.4 1.2 0.7  PROT 7.1 6.7 7.1 6.3*  ALBUMIN 3.0* 2.7* 3.1* 2.8*   No results for input(s): LIPASE, AMYLASE in the last 168 hours. No results for input(s): AMMONIA in the last 168 hours. CBC: Recent Labs  Lab 07/10/19 0257 07/11/19 0129 07/12/19 0355 07/13/19 0440  WBC 14.5* 14.1* 13.4* 14.4*  NEUTROABS 12.3* 11.9* 11.2* 11.9*  HGB 12.2* 12.4* 14.4 13.9  HCT 38.8* 38.9* 45.1 43.5  MCV 86.2 85.7 85.7 85.1  PLT 193 243 256 269   Cardiac Enzymes: No results for input(s): CKTOTAL, CKMB, CKMBINDEX, TROPONINI in the last 168 hours. BNP: Invalid input(s): POCBNP CBG: Recent Labs  Lab 07/14/19 1618 07/14/19 2105 07/15/19 0808 07/15/19 1147 07/15/19 2105  GLUCAP 232* 227* 151* 190* 282*   D-Dimer No results for input(s): DDIMER in the last 72 hours. Hgb A1c No results for input(s): HGBA1C in the last 72 hours. Lipid Profile No results for input(s): CHOL, HDL, LDLCALC, TRIG, CHOLHDL, LDLDIRECT in the last 72 hours. Thyroid function studies No results for input(s): TSH, T4TOTAL, T3FREE, THYROIDAB in the last 72 hours.  Invalid input(s): FREET3 Anemia work up No results for input(s): VITAMINB12,  FOLATE, FERRITIN, TIBC, IRON, RETICCTPCT in the last 72 hours. Urinalysis    Component Value Date/Time   COLORURINE YELLOW 11/27/2015 0859   APPEARANCEUR CLEAR 11/27/2015 0859   LABSPEC 1.021 11/27/2015 0859   PHURINE 7.0 11/27/2015 0859   GLUCOSEU NEGATIVE 11/27/2015 0859   HGBUR NEGATIVE 11/27/2015 0859   BILIRUBINUR NEGATIVE 11/27/2015 0859   KETONESUR NEGATIVE 11/27/2015 0859   PROTEINUR NEGATIVE 11/27/2015 0859   NITRITE NEGATIVE 11/27/2015 0859   LEUKOCYTESUR NEGATIVE 11/27/2015 0859   Sepsis Labs Invalid input(s): PROCALCITONIN,  WBC,  LACTICIDVEN  Microbiology Recent Results (from the past 240 hour(s))  Blood Culture (routine x 2)     Status: None   Collection Time: 07/08/19 10:33 AM   Specimen: BLOOD  Result Value Ref Range Status   Specimen Description   Final    BLOOD RIGHT ARM Performed at [facility], [address] [place], [place] [address]    Special Requests   Final    BOTTLES DRAWN AEROBIC AND ANAEROBIC Blood Culture adequate volume Performed at [facility], [address] [place], [place] [address]    Culture   Final    NO GROWTH 5 DAYS Performed at [facility], [address] [place], [place] [address]    Report Status 07/13/2019 FINAL  Final  Blood Culture (routine x 2)     Status: None   Collection Time: 07/09/19  4:04 AM   Specimen: BLOOD  Result Value Ref Range Status   Specimen Description   Final    BLOOD RIGHT AC Performed at [facility], [address] [place], [place] [address]    Special Requests   Final    BOTTLES DRAWN AEROBIC AND ANAEROBIC Blood Culture adequate volume Performed at [facility], [address] [place], [place] [address]    Culture   Final    NO GROWTH 5 DAYS Performed at [facility], [address] [place], [place] [address]    Report Status 07/14/2019 FINAL  Final     Time coordinating discharge: Over 40  minutes  SIGNED:   Marinda ElkAbraham Feliz Ortiz, MD  [facility] 07/16/2019, 8:06 AM Pager   If 7PM-7AM, please contact night-coverage [email or web address] Password [email or web address]

## 2019-07-16 NOTE — Progress Notes (Addendum)
Occupational Therapy Treatment Patient Details Name: Terry Delgado MRN: [id number] DOB: 1966/03/15 Today's Date: 07/16/2019    History of present illness 53 y.o. male past medical history of atrial fibrillation status post ablation on 04/02/2019 not on anticoagulant presented with fever cough and shortness of breath he relates he was tested, today for SARS-CoV-2 on 06/30/2019 symptom worsen over the last 5 days prior to admission with shortness of breath hematemesis few days ago and diarrhea, but he denies nausea vomiting.   OT comments  Pt progressing towards OT goals, performing hallway level mobility (x1 lap around entire unit) at mod independent level without AD. Pt on RA with O2 sats 89% or greater with activity. Further reviewed/educated pt on energy conservation techniques and activity progression after return home with pt verbalizing understanding, corresponding handout issued. Pt with no further acute OT needs at this time. Recommend pt continue mobilizing and up ad lib with nursing staff while admitted. Acute OT to sign off.    Follow Up Recommendations  No OT follow up    Equipment Recommendations  None recommended by OT(discussed getting shower seat)          Precautions / Restrictions Precautions Precautions: Fall Restrictions Weight Bearing Restrictions: No       Mobility Bed Mobility               General bed mobility comments: received sitting up in recliner  Transfers Overall transfer level: Independent   Transfers: Sit to/from Stand Sit to Stand: Independent              Balance Overall balance assessment: Modified Independent                                         ADL either performed or assessed with clinical judgement   ADL Overall ADL's : Needs assistance/impaired                                   Tub/Shower Transfer Details (indicate cue type and reason): discussed use of shower seat for bathing ADL to  conserve energy during ADL task Functional mobility during ADLs: Modified independent General ADL Comments: pt declined performing morning ADL at time of session, performed functional mobility at hallway level with O2 sats 89% and greater. further focus on energy conservation techniques and activity progression after return home, handout issued                       Cognition Arousal/Alertness: Awake/alert Behavior During Therapy: WFL for tasks assessed/performed Overall Cognitive Status: Within Functional Limits for tasks assessed                                          Exercises Other Exercises Other Exercises: encouraged continued use of IS and flutter valve   Shoulder Instructions       General Comments      Pertinent Vitals/ Pain       Pain Assessment: No/denies pain  Home Living Family/patient expects to be discharged to:: Private residence Living Arrangements: Spouse/significant other Available Help at Discharge: Family;Available PRN/intermittently Type of Home: House Home Access: Stairs to enter Entrance Stairs-Number of Steps: 3   Home Layout: One level  Bathroom Shower/Tub: Engineer, manufacturing: Yes   Home Equipment: None          Prior Functioning/Environment Level of Independence: Independent        Comments: works at [employer] 2X/week        Progress Toward Goals  OT Goals(current goals can now be found in the care plan section)  Progress towards OT goals: Goals met/education completed, patient discharged from [place] Patient Stated Goal: to go home and get back to work OT Goal Formulation: With patient Potential to Achieve Goals: Good ADL Goals Additional ADL Goal #1: Pt will independently verbalzie 3 energy conservation strategies  Plan All goals met and education completed, patient discharged from OT services     Co-evaluation                 AM-PAC OT "6 Clicks" Daily Activity     Outcome Measure   Help from another person eating meals?: None Help from another person taking care of personal grooming?: None Help from another person toileting, which includes using toliet, bedpan, or urinal?: None Help from another person bathing (including washing, rinsing, drying)?: None Help from another person to put on and taking off regular upper body clothing?: None Help from another person to put on and taking off regular lower body clothing?: None 6 Click Score: 24    End of Session    OT Visit Diagnosis: Muscle weakness (generalized) (M62.81)   Activity Tolerance Patient tolerated treatment well   Patient Left in chair;with call bell/phone within reach   Nurse Communication Mobility status        Time: 4128-2081 OT Time Calculation (min): 19 min  Charges: OT General Charges $OT Visit: 1 Visit OT Treatments $Therapeutic Activity: 8-22 mins  Lou Cal, OT [facility] Services Pager [phone] Office [phone]    [name] 07/16/2019, 9:33 AM

## 2019-07-17 ENCOUNTER — Encounter (INDEPENDENT_AMBULATORY_CARE_PROVIDER_SITE_OTHER): Payer: Self-pay

## 2019-07-18 ENCOUNTER — Encounter (INDEPENDENT_AMBULATORY_CARE_PROVIDER_SITE_OTHER): Payer: Self-pay

## 2019-07-19 ENCOUNTER — Encounter (INDEPENDENT_AMBULATORY_CARE_PROVIDER_SITE_OTHER): Payer: Self-pay

## 2019-07-20 ENCOUNTER — Encounter (INDEPENDENT_AMBULATORY_CARE_PROVIDER_SITE_OTHER): Payer: Self-pay

## 2019-07-21 ENCOUNTER — Encounter (INDEPENDENT_AMBULATORY_CARE_PROVIDER_SITE_OTHER): Payer: Self-pay

## 2019-07-22 ENCOUNTER — Encounter (INDEPENDENT_AMBULATORY_CARE_PROVIDER_SITE_OTHER): Payer: Self-pay

## 2019-07-23 ENCOUNTER — Telehealth: Payer: Self-pay

## 2019-07-23 ENCOUNTER — Encounter (INDEPENDENT_AMBULATORY_CARE_PROVIDER_SITE_OTHER): Payer: Self-pay

## 2019-07-23 NOTE — Telephone Encounter (Signed)
Pt. Returned call to discuss [employer].  Reported he noted on questionnaire that weakness was worse today.  Stated he doesn't actually feel weak.  Explained that he did a lot of walking yesterday, and was sore from all the walking.  Denied fever, shortness of breath, or cough.  Reported he is able to get his incentive spirometer all the way to the top today.  Denied any other complaints.  Encouraged to continue to take care of himself, getting good rest and hydration.  Verb. Understanding.

## 2019-07-23 NOTE — Telephone Encounter (Signed)
Left a vm for patient to return call regarding questionnaire

## 2019-07-24 ENCOUNTER — Encounter (INDEPENDENT_AMBULATORY_CARE_PROVIDER_SITE_OTHER): Payer: Self-pay

## 2019-07-25 ENCOUNTER — Encounter (INDEPENDENT_AMBULATORY_CARE_PROVIDER_SITE_OTHER): Payer: Self-pay

## 2019-07-26 ENCOUNTER — Encounter (INDEPENDENT_AMBULATORY_CARE_PROVIDER_SITE_OTHER): Payer: Self-pay

## 2019-07-26 DIAGNOSIS — I480 Paroxysmal atrial fibrillation: Secondary | ICD-10-CM | POA: Diagnosis not present

## 2019-07-26 DIAGNOSIS — I483 Typical atrial flutter: Secondary | ICD-10-CM | POA: Diagnosis not present

## 2019-07-27 ENCOUNTER — Encounter (INDEPENDENT_AMBULATORY_CARE_PROVIDER_SITE_OTHER): Payer: Self-pay

## 2019-07-28 ENCOUNTER — Other Ambulatory Visit: Payer: Self-pay | Admitting: Family Medicine

## 2019-07-28 DIAGNOSIS — E041 Nontoxic single thyroid nodule: Secondary | ICD-10-CM

## 2019-07-28 DIAGNOSIS — Z23 Encounter for immunization: Secondary | ICD-10-CM | POA: Diagnosis not present

## 2019-07-28 DIAGNOSIS — U071 COVID-19: Secondary | ICD-10-CM | POA: Diagnosis not present

## 2019-07-29 ENCOUNTER — Encounter (INDEPENDENT_AMBULATORY_CARE_PROVIDER_SITE_OTHER): Payer: Self-pay

## 2019-07-30 ENCOUNTER — Ambulatory Visit
Admission: RE | Admit: 2019-07-30 | Discharge: 2019-07-30 | Disposition: A | Discharge status: Home / Self Care | Payer: BC Managed Care – PPO | Source: Ambulatory Visit | Attending: Family Medicine | Admitting: Family Medicine

## 2019-07-30 DIAGNOSIS — E041 Nontoxic single thyroid nodule: Secondary | ICD-10-CM | POA: Diagnosis not present

## 2019-09-15 DIAGNOSIS — I483 Typical atrial flutter: Secondary | ICD-10-CM | POA: Diagnosis not present

## 2019-09-15 DIAGNOSIS — I4819 Other persistent atrial fibrillation: Secondary | ICD-10-CM | POA: Diagnosis not present

## 2019-11-16 DIAGNOSIS — Z125 Encounter for screening for malignant neoplasm of prostate: Secondary | ICD-10-CM | POA: Diagnosis not present

## 2019-11-16 DIAGNOSIS — E785 Hyperlipidemia, unspecified: Secondary | ICD-10-CM | POA: Diagnosis not present

## 2019-11-16 DIAGNOSIS — Z0000 Encounter for general adult medical examination without abnormal findings: Secondary | ICD-10-CM | POA: Diagnosis not present

## 2019-12-21 DIAGNOSIS — I480 Paroxysmal atrial fibrillation: Secondary | ICD-10-CM | POA: Diagnosis not present

## 2019-12-21 DIAGNOSIS — R002 Palpitations: Secondary | ICD-10-CM | POA: Diagnosis not present

## 2019-12-21 DIAGNOSIS — I4819 Other persistent atrial fibrillation: Secondary | ICD-10-CM | POA: Diagnosis not present

## 2019-12-21 DIAGNOSIS — I483 Typical atrial flutter: Secondary | ICD-10-CM | POA: Diagnosis not present

## 2019-12-24 DIAGNOSIS — I483 Typical atrial flutter: Secondary | ICD-10-CM | POA: Diagnosis not present

## 2019-12-24 DIAGNOSIS — I4819 Other persistent atrial fibrillation: Secondary | ICD-10-CM | POA: Diagnosis not present

## 2019-12-28 DIAGNOSIS — Z23 Encounter for immunization: Secondary | ICD-10-CM | POA: Diagnosis not present

## 2020-01-06 DIAGNOSIS — I480 Paroxysmal atrial fibrillation: Secondary | ICD-10-CM | POA: Diagnosis not present

## 2020-01-06 DIAGNOSIS — I483 Typical atrial flutter: Secondary | ICD-10-CM | POA: Diagnosis not present

## 2020-01-06 DIAGNOSIS — R002 Palpitations: Secondary | ICD-10-CM | POA: Diagnosis not present

## 2020-01-27 DIAGNOSIS — I483 Typical atrial flutter: Secondary | ICD-10-CM | POA: Diagnosis not present

## 2020-01-27 DIAGNOSIS — Z01810 Encounter for preprocedural cardiovascular examination: Secondary | ICD-10-CM | POA: Diagnosis not present

## 2020-01-27 DIAGNOSIS — I4891 Unspecified atrial fibrillation: Secondary | ICD-10-CM | POA: Diagnosis not present

## 2020-01-27 DIAGNOSIS — Z8616 Personal history of COVID-19: Secondary | ICD-10-CM | POA: Diagnosis not present

## 2020-01-27 DIAGNOSIS — Z01812 Encounter for preprocedural laboratory examination: Secondary | ICD-10-CM | POA: Diagnosis not present

## 2020-01-27 DIAGNOSIS — I4819 Other persistent atrial fibrillation: Secondary | ICD-10-CM | POA: Diagnosis not present

## 2020-01-31 DIAGNOSIS — Z79899 Other long term (current) drug therapy: Secondary | ICD-10-CM | POA: Diagnosis not present

## 2020-01-31 DIAGNOSIS — I10 Essential (primary) hypertension: Secondary | ICD-10-CM | POA: Diagnosis not present

## 2020-01-31 DIAGNOSIS — I471 Supraventricular tachycardia: Secondary | ICD-10-CM | POA: Diagnosis not present

## 2020-01-31 DIAGNOSIS — I483 Typical atrial flutter: Secondary | ICD-10-CM | POA: Diagnosis not present

## 2020-01-31 DIAGNOSIS — Z8616 Personal history of COVID-19: Secondary | ICD-10-CM | POA: Diagnosis not present

## 2020-01-31 DIAGNOSIS — Z9889 Other specified postprocedural states: Secondary | ICD-10-CM | POA: Diagnosis not present

## 2020-01-31 DIAGNOSIS — Z8701 Personal history of pneumonia (recurrent): Secondary | ICD-10-CM | POA: Diagnosis not present

## 2020-01-31 DIAGNOSIS — I4819 Other persistent atrial fibrillation: Secondary | ICD-10-CM | POA: Diagnosis not present

## 2020-01-31 DIAGNOSIS — Z7982 Long term (current) use of aspirin: Secondary | ICD-10-CM | POA: Diagnosis not present

## 2020-02-01 DIAGNOSIS — Z8616 Personal history of COVID-19: Secondary | ICD-10-CM | POA: Diagnosis not present

## 2020-02-01 DIAGNOSIS — Z9889 Other specified postprocedural states: Secondary | ICD-10-CM | POA: Diagnosis not present

## 2020-02-01 DIAGNOSIS — Z8701 Personal history of pneumonia (recurrent): Secondary | ICD-10-CM | POA: Diagnosis not present

## 2020-02-01 DIAGNOSIS — Z79899 Other long term (current) drug therapy: Secondary | ICD-10-CM | POA: Diagnosis not present

## 2020-02-01 DIAGNOSIS — I471 Supraventricular tachycardia: Secondary | ICD-10-CM | POA: Diagnosis not present

## 2020-02-01 DIAGNOSIS — Z7982 Long term (current) use of aspirin: Secondary | ICD-10-CM | POA: Diagnosis not present

## 2020-02-01 DIAGNOSIS — I10 Essential (primary) hypertension: Secondary | ICD-10-CM | POA: Diagnosis not present

## 2020-02-01 DIAGNOSIS — I483 Typical atrial flutter: Secondary | ICD-10-CM | POA: Diagnosis not present

## 2020-02-01 DIAGNOSIS — I4819 Other persistent atrial fibrillation: Secondary | ICD-10-CM | POA: Diagnosis not present

## 2020-02-08 DIAGNOSIS — R0681 Apnea, not elsewhere classified: Secondary | ICD-10-CM | POA: Diagnosis not present

## 2020-02-08 DIAGNOSIS — I4819 Other persistent atrial fibrillation: Secondary | ICD-10-CM | POA: Diagnosis not present

## 2020-02-08 DIAGNOSIS — R0683 Snoring: Secondary | ICD-10-CM | POA: Diagnosis not present

## 2020-02-08 DIAGNOSIS — G4719 Other hypersomnia: Secondary | ICD-10-CM | POA: Diagnosis not present

## 2020-02-21 DIAGNOSIS — E785 Hyperlipidemia, unspecified: Secondary | ICD-10-CM | POA: Diagnosis not present

## 2020-03-02 DIAGNOSIS — I4819 Other persistent atrial fibrillation: Secondary | ICD-10-CM | POA: Diagnosis not present

## 2020-03-02 DIAGNOSIS — R0683 Snoring: Secondary | ICD-10-CM | POA: Diagnosis not present

## 2020-03-02 DIAGNOSIS — G4719 Other hypersomnia: Secondary | ICD-10-CM | POA: Diagnosis not present

## 2020-03-02 DIAGNOSIS — R0681 Apnea, not elsewhere classified: Secondary | ICD-10-CM | POA: Diagnosis not present

## 2020-03-07 DIAGNOSIS — I483 Typical atrial flutter: Secondary | ICD-10-CM | POA: Diagnosis not present

## 2020-03-07 DIAGNOSIS — I4819 Other persistent atrial fibrillation: Secondary | ICD-10-CM | POA: Diagnosis not present

## 2020-03-28 DIAGNOSIS — I4819 Other persistent atrial fibrillation: Secondary | ICD-10-CM | POA: Diagnosis not present

## 2020-03-28 DIAGNOSIS — G4733 Obstructive sleep apnea (adult) (pediatric): Secondary | ICD-10-CM | POA: Diagnosis not present

## 2020-03-28 DIAGNOSIS — R0683 Snoring: Secondary | ICD-10-CM | POA: Diagnosis not present

## 2020-03-28 DIAGNOSIS — G4719 Other hypersomnia: Secondary | ICD-10-CM | POA: Diagnosis not present

## 2020-04-06 DIAGNOSIS — I4819 Other persistent atrial fibrillation: Secondary | ICD-10-CM | POA: Diagnosis not present

## 2020-04-06 DIAGNOSIS — I483 Typical atrial flutter: Secondary | ICD-10-CM | POA: Diagnosis not present

## 2020-06-06 DIAGNOSIS — I4819 Other persistent atrial fibrillation: Secondary | ICD-10-CM | POA: Diagnosis not present

## 2020-10-13 ENCOUNTER — Other Ambulatory Visit: Payer: Self-pay | Admitting: Family Medicine

## 2020-10-13 DIAGNOSIS — E041 Nontoxic single thyroid nodule: Secondary | ICD-10-CM

## 2020-10-26 ENCOUNTER — Ambulatory Visit
Admission: RE | Admit: 2020-10-26 | Discharge: 2020-10-26 | Disposition: A | Discharge status: Home / Self Care | Payer: BC Managed Care – PPO | Source: Ambulatory Visit | Attending: Family Medicine | Admitting: Family Medicine

## 2020-10-26 DIAGNOSIS — E041 Nontoxic single thyroid nodule: Secondary | ICD-10-CM | POA: Diagnosis not present

## 2020-11-02 ENCOUNTER — Other Ambulatory Visit: Payer: Self-pay | Admitting: Family Medicine

## 2020-11-02 DIAGNOSIS — E041 Nontoxic single thyroid nodule: Secondary | ICD-10-CM

## 2020-11-16 ENCOUNTER — Other Ambulatory Visit (HOSPITAL_COMMUNITY)
Admission: RE | Admit: 2020-11-16 | Discharge: 2020-11-16 | Disposition: A | Discharge status: Home / Self Care | Payer: BC Managed Care – PPO | Source: Ambulatory Visit | Attending: Radiology | Admitting: Radiology

## 2020-11-16 ENCOUNTER — Ambulatory Visit
Admission: RE | Admit: 2020-11-16 | Discharge: 2020-11-16 | Disposition: A | Discharge status: Home / Self Care | Payer: BC Managed Care – PPO | Source: Ambulatory Visit | Attending: Family Medicine | Admitting: Family Medicine

## 2020-11-16 DIAGNOSIS — E041 Nontoxic single thyroid nodule: Secondary | ICD-10-CM | POA: Insufficient documentation

## 2020-11-17 LAB — CYTOLOGY - NON PAP

## 2020-11-21 DIAGNOSIS — Z0000 Encounter for general adult medical examination without abnormal findings: Secondary | ICD-10-CM | POA: Diagnosis not present

## 2020-11-21 DIAGNOSIS — Z125 Encounter for screening for malignant neoplasm of prostate: Secondary | ICD-10-CM | POA: Diagnosis not present

## 2020-11-21 DIAGNOSIS — R0683 Snoring: Secondary | ICD-10-CM | POA: Diagnosis not present

## 2020-11-21 DIAGNOSIS — E785 Hyperlipidemia, unspecified: Secondary | ICD-10-CM | POA: Diagnosis not present

## 2020-11-21 DIAGNOSIS — G4719 Other hypersomnia: Secondary | ICD-10-CM | POA: Diagnosis not present

## 2020-11-21 DIAGNOSIS — G4733 Obstructive sleep apnea (adult) (pediatric): Secondary | ICD-10-CM | POA: Diagnosis not present

## 2020-11-21 DIAGNOSIS — I4819 Other persistent atrial fibrillation: Secondary | ICD-10-CM | POA: Diagnosis not present

## 2020-11-24 DIAGNOSIS — Z23 Encounter for immunization: Secondary | ICD-10-CM | POA: Diagnosis not present

## 2020-11-29 DIAGNOSIS — H903 Sensorineural hearing loss, bilateral: Secondary | ICD-10-CM | POA: Diagnosis not present

## 2021-01-04 DIAGNOSIS — I4819 Other persistent atrial fibrillation: Secondary | ICD-10-CM | POA: Diagnosis not present

## 2021-01-04 DIAGNOSIS — G4719 Other hypersomnia: Secondary | ICD-10-CM | POA: Diagnosis not present

## 2021-01-04 DIAGNOSIS — G4733 Obstructive sleep apnea (adult) (pediatric): Secondary | ICD-10-CM | POA: Diagnosis not present

## 2021-01-12 ENCOUNTER — Ambulatory Visit: Payer: BC Managed Care – PPO | Admitting: Internal Medicine

## 2021-01-12 ENCOUNTER — Other Ambulatory Visit: Payer: Self-pay

## 2021-01-12 ENCOUNTER — Encounter: Payer: Self-pay | Admitting: Internal Medicine

## 2021-01-12 VITALS — BP 116/78 | HR 86 | Ht 72.0 in | Wt 277.0 lb

## 2021-01-12 DIAGNOSIS — E041 Nontoxic single thyroid nodule: Secondary | ICD-10-CM | POA: Diagnosis not present

## 2021-01-12 NOTE — Patient Instructions (Addendum)
Please return in 10/2021, but send me a message to order a new U/S at the end of January.   Thyroid Nodule  A thyroid nodule is an isolated growth of thyroid cells that forms a lump in your thyroid gland. The thyroid gland is a butterfly-shaped gland. It is found in the lower front of your neck. This gland sends chemical messengers (hormones) through your blood to all parts of your body. These hormones are important in regulating your body temperature and helping your body to use energy. Thyroid nodules are common. Most are not cancerous (benign). You may have one nodule or several nodules. Different types of thyroid nodules include nodules that:  Grow and fill with fluid (thyroid cysts).  Produce too much thyroid hormone (hot nodules or hyperthyroid).  Produce no thyroid hormone (cold nodules or hypothyroid).  Form from cancer cells (thyroid cancers). What are the causes? In most cases, the cause of this condition is not known. What increases the risk? The following factors may make you more likely to develop this condition.  Age. Thyroid nodules become more common in people who are older than 54 years of age.  Gender. ? Benign thyroid nodules are more common in women. ? Cancerous (malignant) thyroid nodules are more common in men.  A family history that includes: ? Thyroid nodules. ? Pheochromocytoma. ? Thyroid carcinoma. ? Hyperparathyroidism.  Certain kinds of thyroid diseases, such as Hashimoto's thyroiditis.  Lack of iodine in your diet.  A history of head and neck radiation, such as from previous cancer treatment. What are the signs or symptoms? In many cases, there are no symptoms. If you have symptoms, they may include:  A lump in your lower neck.  Feeling a lump or tickle in your throat.  Pain in your neck, jaw, or ear.  Having trouble swallowing. Hot nodules may cause symptoms that include:  Weight loss.  Warm, flushed skin.  Feeling hot.  Feeling  nervous.  A racing heartbeat. Cold nodules may cause symptoms that include:  Weight gain.  Dry skin.  Brittle hair. This may also occur with hair loss.  Feeling cold.  Fatigue. Thyroid cancer nodules may cause symptoms that include:  Hard nodules that feel stuck to the thyroid gland.  Hoarseness.  Lumps in the glands near your thyroid (lymph nodes). How is this diagnosed? A thyroid nodule may be felt by your health care provider during a physical exam. This condition may also be diagnosed based on your symptoms. You may also have tests, including:  An ultrasound. This may be done to confirm the diagnosis.  A biopsy. This involves taking a sample from the nodule and looking at it under a microscope.  Blood tests to make sure that your thyroid is working properly.  A thyroid scan. This test uses a radioactive tracer injected into a vein to create an image of the thyroid gland on a computer screen.  Imaging tests such as MRI or CT scan. These may be done if: ? Your nodule is large. ? Your nodule is blocking your airway. ? Cancer is suspected. How is this treated? Treatment depends on the cause and size of your nodule or nodules. If the nodule is benign, treatment may not be necessary. Your health care provider may monitor the nodule to see if it goes away without treatment. If the nodule continues to grow, is cancerous, or does not go away, treatment may be needed. Treatment may include:  Having a cystic nodule drained with a needle.  Ablation therapy. In this treatment, alcohol is injected into the area of the nodule to destroy the cells. Ablation with heat (thermal ablation) may also be used.  Radioactive iodine. In this treatment, radioactive iodine is given as a pill or liquid that you drink. This substance causes the thyroid nodule to shrink.  Surgery to remove the nodule. Part or all of your thyroid gland may need to be removed as well.  Medicines. Follow these  instructions at home:  Pay attention to any changes in your nodule.  Take over-the-counter and prescription medicines only as told by your health care provider.  Keep all follow-up visits as told by your health care provider. This is important. Contact a health care provider if:  Your voice changes.  You have trouble swallowing.  You have pain in your neck, ear, or jaw that is getting worse.  Your nodule gets bigger.  Your nodule starts to make it harder for you to breathe.  Your muscles look like they are shrinking (muscle wasting). Get help right away if:  You have chest pain.  There is a loss of consciousness.  You have a sudden fever.  You feel confused.  You are seeing or hearing things that other people do not see or hear (having hallucinations).  You feel very weak.  You have mood swings.  You feel very restless.  You feel suddenly nauseous or throw up.  You suddenly have diarrhea. Summary  A thyroid nodule is an isolated growth of thyroid cells that forms a lump in your thyroid gland.  Thyroid nodules are common. Most are not cancerous (benign). You may have one nodule or several nodules.  Treatment depends on the cause and size of your nodule or nodules. If the nodule is benign, treatment may not be necessary.  Your health care provider may monitor the nodule to see if it goes away without treatment. If the nodule continues to grow, is cancerous, or does not go away, treatment may be needed. This information is not intended to replace advice given to you by your health care provider. Make sure you discuss any questions you have with your health care provider. Document Revised: 04/10/2018 Document Reviewed: 04/13/2018 Elsevier Patient Education  2021 [employer].

## 2021-01-12 NOTE — Progress Notes (Signed)
Patient ID: Terry Delgado, male   DOB: 22-Feb-1966, 55 y.o.   MRN: [id number]   This visit occurred during the SARS-CoV-2 public health emergency.  Safety protocols were in place, including screening questions prior to the visit, additional usage of staff PPE, and extensive cleaning of exam room while observing appropriate contact time as indicated for disinfecting solutions.   HPI  Terry Delgado is a 55 y.o.-year-old very pleasant male, referred by his PCP, Dr. Cliffton Asters, for evaluation for a right thyroid nodule.  Patient was found to have a thyroid nodule incidentally on the CT chest obtained in the setting of COVID-19 infection complicated by pneumonia. A Thyroid U/S (07/30/2019) showed a right thyroid nodule, measuring 2.2 x 1.6 x 1.4 cm.  A repeat ultrasound earlier this year (10/26/2020) showed an increase in size.  Biopsy (11/16/2020) was inconclusive due to scant material.  Thyroid U/S (10/26/2020): Right mid thyroid nodule measuring 2.7 cm (increased from 2.2 cm): Parenchymal Echotexture: Mildly heterogenous Isthmus: 4 mm Right lobe: 5.4 x 2.0 x 1.9 cm Left lobe: 4.8 x 1.4 x 1.4 cm _________________________________________________________  Nodule # 1: Location: Right; Mid Maximum size: 2.7, previously 2.2 cm; Other 2 dimensions: 1.7 x 1.7 cm Composition: solid/almost completely solid (2) Echogenicity: isoechoic (1) **Given size (>/= 2.5 cm) and appearance, fine needle aspiration of this mildly suspicious nodule should be considered based on TI-RADS criteria. _________________________________________________________  No other significant thyroid abnormality. No hypervascularity. No regional adenopathy.  IMPRESSION: 2.7 cm right mid thyroid TR 3 nodule now meets criteria for biopsy as above.      FNA (11/16/2020): Scant follicular epithelial cells present (Bethesda category 1)  Pt denies: - feeling nodules in neck - hoarseness - dysphagia - choking - SOB with lying down  I  reviewed pt's thyroid tests: 11/21/2020: TSH 2.10 Lab Results  Component Value Date   TSH 1.512 07/10/2019    Pt mentions: - fatigue, especially after starting working nights a month ago  But denies: - heat intolerance/cold intolerance - tremors - palpitations - anxiety/depression - hyperdefecation/constipation - weight loss, but gained approximately 4 pounds since he started working nights  + FH of thyroid ds. In father. No FH of thyroid cancer. No h/o radiation tx to head or neck.  No steroid use. No herbal supplements. No Biotin supplements or Hair, Skin and Nails vitamins. On MVI.  Pt also has a history of OSA  - started CPAP a week ago.  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 On ASA 81, Covid19 06/2019.  ROS: Constitutional: + See HPI  Eyes: no blurry vision, no xerophthalmia ENT: no sore throat,  + see HPI Cardiovascular: no CP/SOB/palpitations/leg swelling Respiratory: no cough/SOB Gastrointestinal: no N/V/+ alternating: D/C, also, + heartburn Musculoskeletal: +muscle aches/ Nojoint aches Skin: no rashes Neurological: no tremors/numbness/tingling/dizziness Psychiatric: no depression/anxiety + Difficulty with erections  Past Medical History:  Diagnosis Date  . A-fib (HCC)   . Allergy   . GERD (gastroesophageal reflux disease)   . Hypertension    Past Surgical History:  Procedure Laterality Date  . STOMACH SURGERY     infant   Social History   Socioeconomic History  . Marital status: Married    Spouse name: Not on file  . Number of children: 2  . Years of education: Not on file  . Highest education level: Not on file  Occupational History  . Occupation: Chief of Staff  Tobacco Use  . Smoking status: Never Smoker  . Smokeless  tobacco: Never Used  Vaping Use  . Vaping Use: Never used  Substance and Sexual Activity  . Alcohol use: Yes    Alcohol/week: 0.0 standard drinks    Comment: Beer and wine-3 drinks weekly   . Drug use: No  . Sexual activity: Not on file  Other Topics Concern  . Not on file  Social History Narrative  . Not on file   Social Determinants of Health   Financial Resource Strain: Not on file  Food Insecurity: Not on file  Transportation Needs: Not on file  Physical Activity: Not on file  Stress: Not on file  Social Connections: Not on file  Intimate Partner Violence: Not on file   Current Outpatient Medications on File Prior to Visit  Medication Sig Dispense Refill  . aspirin EC 81 MG tablet Take 81 mg by mouth daily.    . Chlorpheniramine Maleate (ALLERGY PO) Take by mouth.    . Multiple Vitamin (MULTI-VITAMIN DAILY PO) Take by mouth.     No current facility-administered medications on file prior to visit.   Allergies  Allergen Reactions  . Penicillins Rash    Has patient had a PCN reaction causing immediate rash, facial/tongue/throat swelling, SOB or lightheadedness with hypotension: unknown Has patient had a PCN reaction causing severe rash involving mucus membranes or skin necrosis: No Has patient had a PCN reaction that required hospitalization No Has patient had a PCN reaction occurring within the last 10 years: No If all of the above answers are "NO", then may proceed with Cephalosporin use.   Family History  Problem Relation Age of Onset  . Cancer Mother   . Hypertension Mother   . Hypertension Father   . Diabetes Father   . Hyperlipidemia Father   . Heart disease Father   . Thyroid disease Father   . COPD Brother   . Diabetes Brother   . Depression Brother     PE: BP 116/78 (BP Location: Right Arm, Patient Position: Sitting, Cuff Size: Normal)   Pulse 86   Ht 6' (1.829 m)   Wt 277 lb (125.6 kg)   SpO2 96%   BMI 37.57 kg/m  Wt Readings from Last 3 Encounters:  01/12/21 277 lb (125.6 kg)  07/09/19 260 lb 5.8 oz (118.1 kg)  12/11/16 268 lb 12.8 oz (121.9 kg)   Constitutional: overweight, in NAD Eyes: PERRLA, EOMI, no exophthalmos ENT:  moist mucous membranes, no thyromegaly, no thyroid nodules palpable; no cervical lymphadenopathy Cardiovascular: RRR, No MRG Respiratory: CTA B Gastrointestinal: abdomen soft, NT, ND, BS+ Musculoskeletal: no deformities, strength intact in all 4;  Skin: moist, warm, no rashes Neurological: no tremor with outstretched hands, DTR normal in all 4  ASSESSMENT: 1.  Right mid lobe thyroid nodule  PLAN: 1.  Right mid lobe thyroid nodule - I reviewed the images of his thyroid ultrasound along with the patient. I pointed out that the dominant nodule is large, this being a risk factor for cancer.  Otherwise, the nodule is: - not hypoechoic - without microcalcifications - without internal blood flow - more wide than tall - well delimited from surrounding tissue Pt does not have a thyroid cancer family history or a personal history of RxTx to head/neck. All these would favor benignity.  - The nodule was biopsied however, the results were inconclusive due to scant material.  We discussed that in this case, a repeat biopsy is indicated in 6-12 months to allow the previous biopsy site to heal.  Since the nodule  appears to be low risk based on ultrasound characteristics (except for size) we can repeat this in a year.  Patient agrees with the plan. - I advised him to get in touch with me around the end of 09/2021 so I can order this - if the new biopsy is benign, we can just continue to follow him on a yearly basis with ultrasounds after another year or 2 - if the new biopsy is still inconclusive, we may need to proceed with thyroid lobectomy versus total thyroidectomy for clarification of diagnosis - if the new biopsy shows malignancy, we discussed about a referral to surgery for thyroidectomy - We discussed about thyroid cancer is being indolent, usually with good life expectancy and quality of life - I'll see him back in 9 months.  I advised him to get in touch with me so I can order a new ultrasound at  that time so we can review it at the time of the next visit  Carlus Pavlov, MD PhD [facility] Endocrinology

## 2021-02-03 DIAGNOSIS — I4819 Other persistent atrial fibrillation: Secondary | ICD-10-CM | POA: Diagnosis not present

## 2021-02-03 DIAGNOSIS — G4733 Obstructive sleep apnea (adult) (pediatric): Secondary | ICD-10-CM | POA: Diagnosis not present

## 2021-02-03 DIAGNOSIS — G4719 Other hypersomnia: Secondary | ICD-10-CM | POA: Diagnosis not present

## 2021-02-06 DIAGNOSIS — G4719 Other hypersomnia: Secondary | ICD-10-CM | POA: Diagnosis not present

## 2021-02-06 DIAGNOSIS — G4733 Obstructive sleep apnea (adult) (pediatric): Secondary | ICD-10-CM | POA: Diagnosis not present

## 2021-02-06 DIAGNOSIS — I4819 Other persistent atrial fibrillation: Secondary | ICD-10-CM | POA: Diagnosis not present

## 2021-02-06 DIAGNOSIS — Z9989 Dependence on other enabling machines and devices: Secondary | ICD-10-CM | POA: Diagnosis not present

## 2021-03-06 DIAGNOSIS — G4733 Obstructive sleep apnea (adult) (pediatric): Secondary | ICD-10-CM | POA: Diagnosis not present

## 2021-03-06 DIAGNOSIS — I4819 Other persistent atrial fibrillation: Secondary | ICD-10-CM | POA: Diagnosis not present

## 2021-03-06 DIAGNOSIS — G4719 Other hypersomnia: Secondary | ICD-10-CM | POA: Diagnosis not present

## 2021-04-05 DIAGNOSIS — I4819 Other persistent atrial fibrillation: Secondary | ICD-10-CM | POA: Diagnosis not present

## 2021-04-05 DIAGNOSIS — G4719 Other hypersomnia: Secondary | ICD-10-CM | POA: Diagnosis not present

## 2021-04-05 DIAGNOSIS — G4733 Obstructive sleep apnea (adult) (pediatric): Secondary | ICD-10-CM | POA: Diagnosis not present

## 2021-04-18 IMAGING — CT CT ANGIO CHEST
2 of 6 series · 17 of 36 positions shown · IV contrast (omnipaque)
Comparison: Chest radiograph July 08, 2019; CT abdomen and
pelvis November 27, 2015

CLINICAL DATA: Shortness of breath. YARBP-GV positive. Abdominal
pain and diarrhea

EXAM:
CT ANGIOGRAPHY CHEST
CT ABDOMEN AND PELVIS WITH CONTRAST
TECHNIQUE: Multidetector CT imaging of the chest was performed using the
standard protocol during bolus administration of intravenous
contrast. Multiplanar CT image reconstructions and MIPs were
obtained to evaluate the vascular anatomy. Multidetector CT imaging
of the abdomen and pelvis was performed using the standard protocol
during bolus administration of intravenous contrast.
CONTRAST:  100mL OMNIPAQUE IOHEXOL 350 MG/ML SOLN

[Series 3: thins · axial · 0.79mm/px · z∈[-63,+197]mm · 16 of 290 slices shown]
[im 15/290  lung]
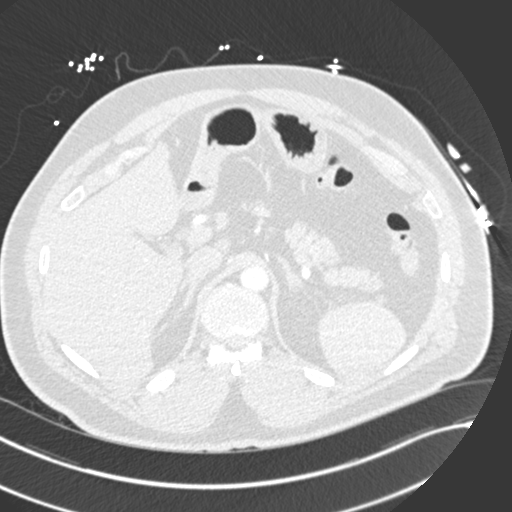
[im 29/290  mediastinal]
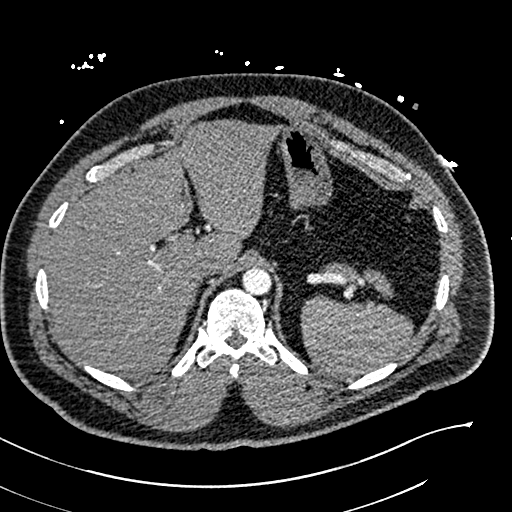
[im 44/290  lung]
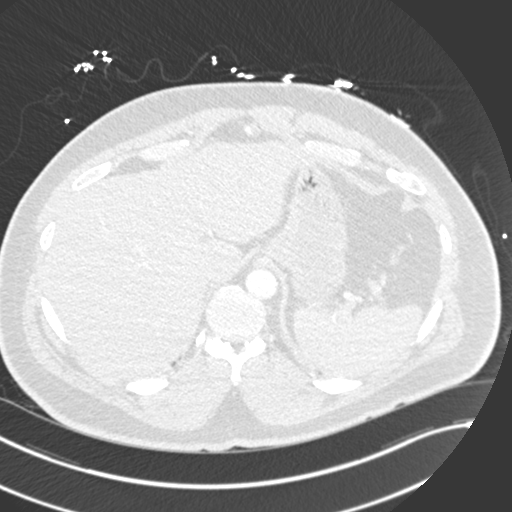
[im 73/290  mediastinal]
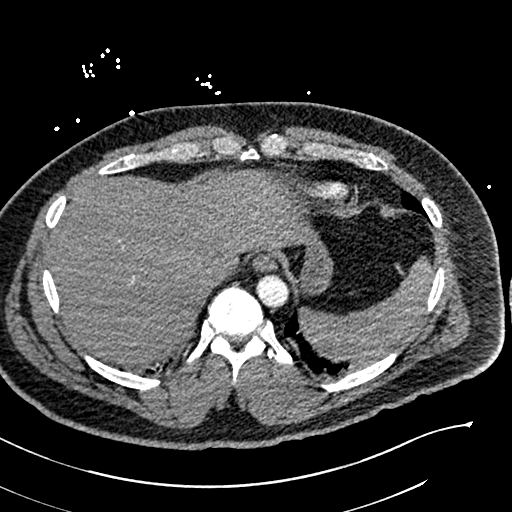
[im 87/290  lung]
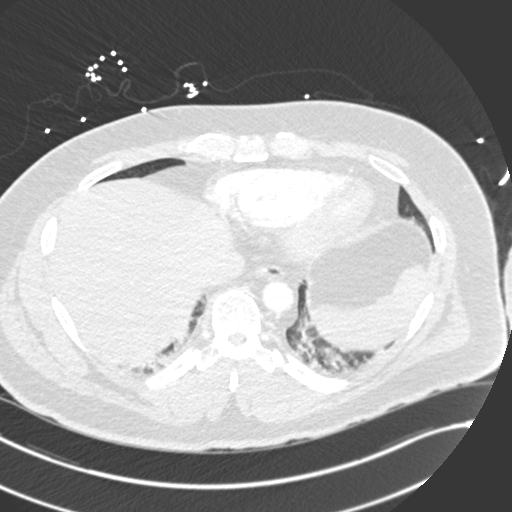
[im 102/290  mediastinal]
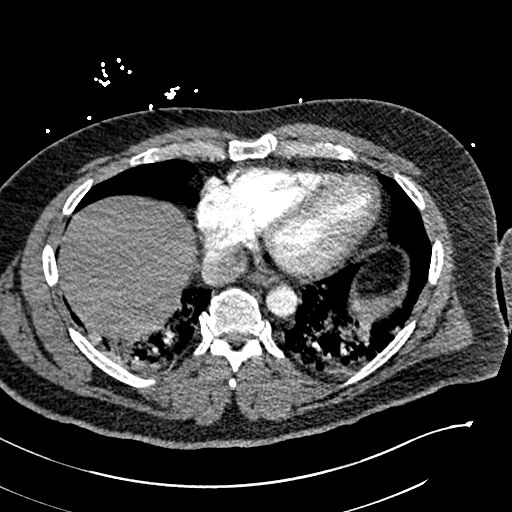
[im 116/290  lung]
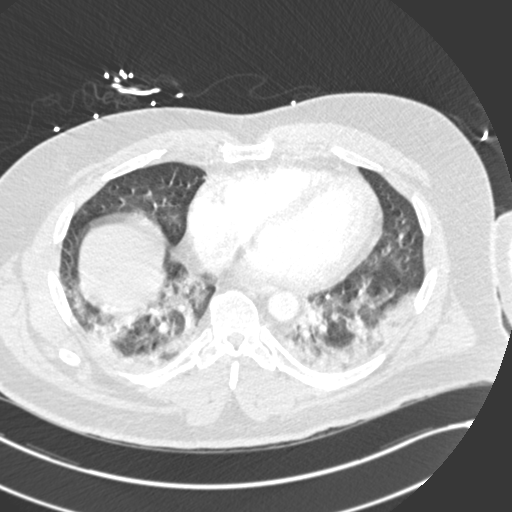
[im 131/290  mediastinal]
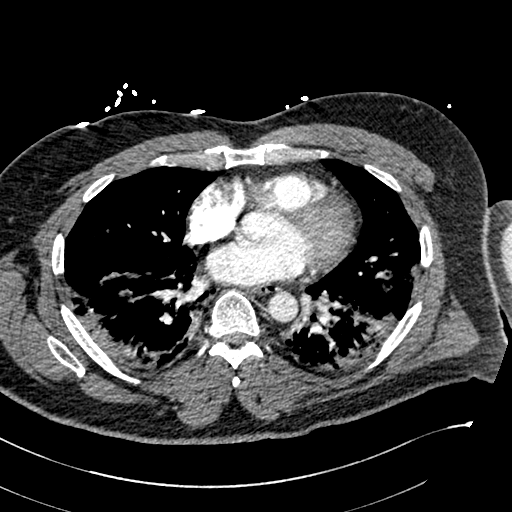
[im 159/290  lung]
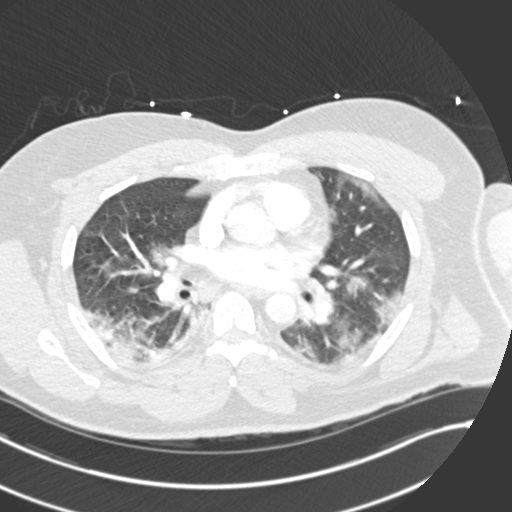
[im 174/290  mediastinal]
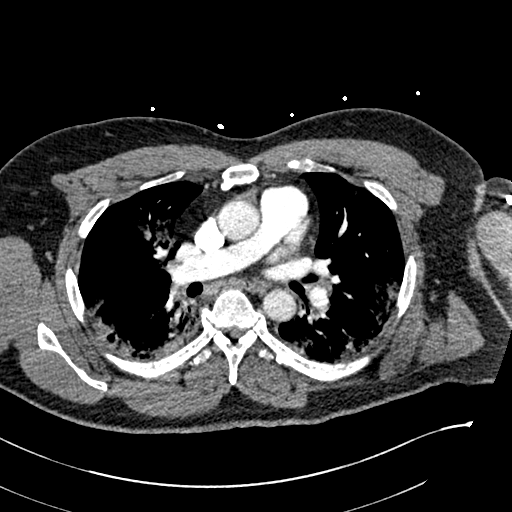
[im 188/290  lung]
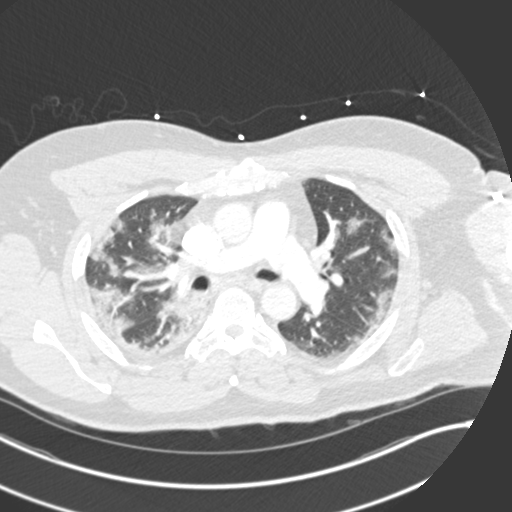
[im 203/290  mediastinal]
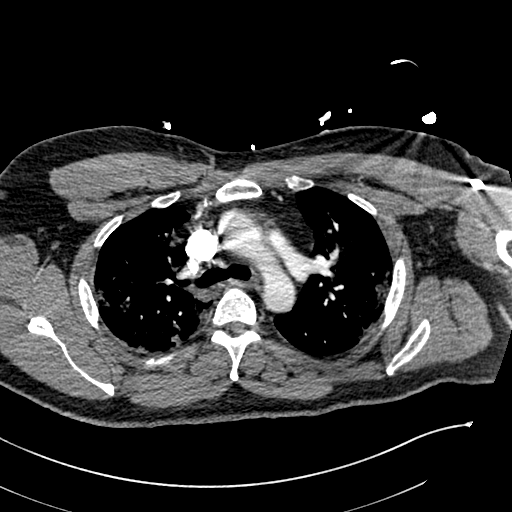
[im 217/290  lung]
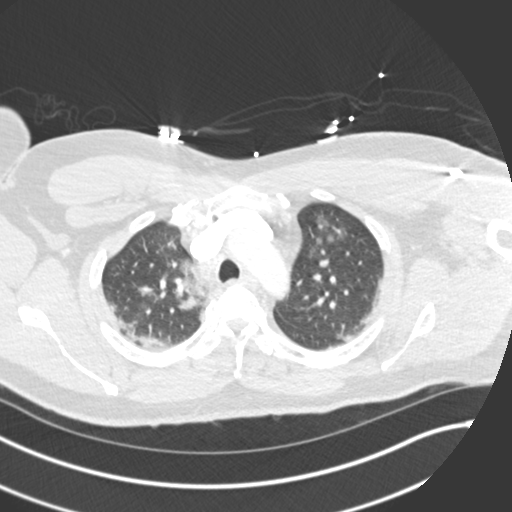
[im 246/290  mediastinal]
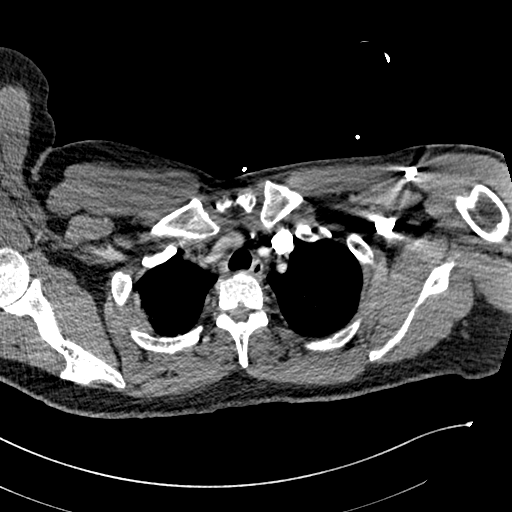
[im 261/290  lung]
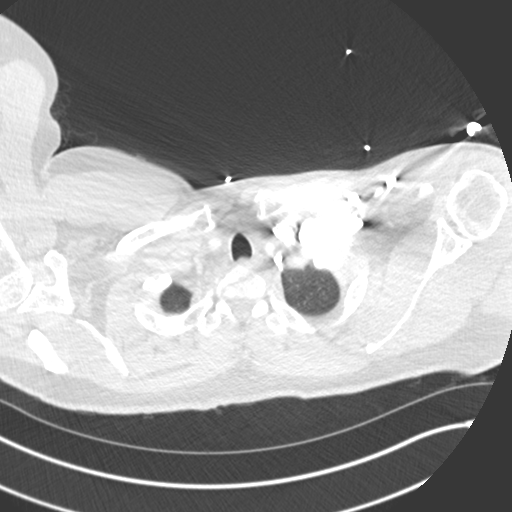
[im 275/290  mediastinal]
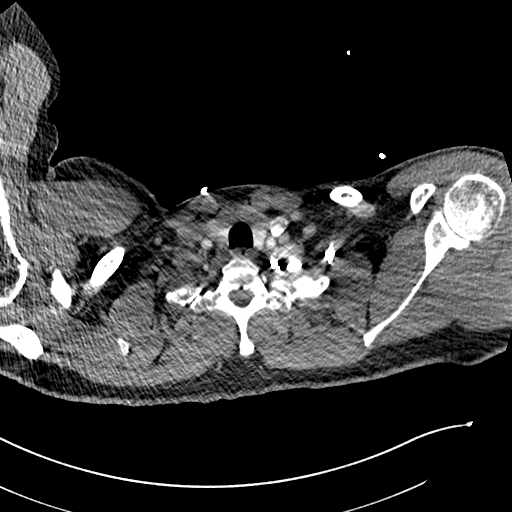

[Series 4: coronal mpr · coronal · 0.59mm/px · 1 of 143 slices shown]
[im 72/143  mediastinal]
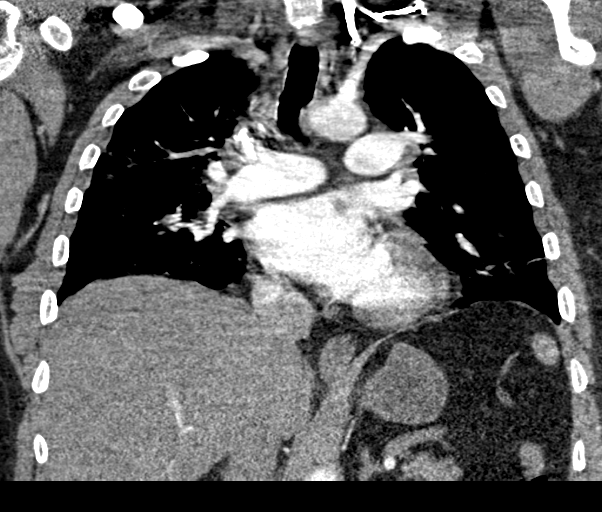

[17 of 36 positions shown; findings below may reference images not displayed]

FINDINGS: CTA CHEST FINDINGS

Cardiovascular: There is no demonstrable pulmonary embolus. There is
no thoracic aortic aneurysm or dissection. The visualized great
vessels appear normal. There is no pericardial effusion or
pericardial thickening.

Mediastinum/Nodes: There is a nodular opacity in the right lobe of
the thyroid measuring 1.8 x 1.5 cm. There is no demonstrable
thoracic adenopathy by size criteria. There are occasional
subcentimeter mediastinal lymph nodes. No esophageal lesions are
evident.

Lungs/Pleura: There is extensive airspace opacity throughout the
lungs diffusely with the greatest concentration of airspace opacity
in both lower lobes. The appearance of the airspace disease in the
upper lobes and in the right middle lobe is consistent with
ground-glass type opacity. There is a mixture ground-glass opacity
and relative consolidation in both lower lobes and in the inferior
lingula. There are no appreciable pleural effusions.

Musculoskeletal: No blastic or lytic bone lesions are evident. No
chest wall lesions.

Review of the MIP images confirms the above findings.

CT ABDOMEN and PELVIS FINDINGS

Hepatobiliary: No focal liver lesions are evident. Gallbladder wall
is not appreciably thickened. There is no biliary duct dilatation.

Pancreas: No pancreatic mass or inflammatory focus.

Spleen: No splenic lesions are evident.

Adrenals/Urinary Tract: Adrenals bilaterally appear normal. There is
a 7 mm cyst in the upper pole of the left kidney. There is a 6 mm
cyst in the lower pole left kidney. There is no evident
hydronephrosis on either side. There is no appreciable renal or
ureteral calculus on either side. Urinary bladder is midline with
wall thickness within normal limits.

Stomach/Bowel: There is no appreciable bowel wall or mesenteric
thickening. Note that there is a degree of malrotation. Duodenum
does not pass between the superior mesenteric artery and aorta. The
cecum is located in the mid abdomen. No evident bowel obstruction.
Terminal ileum appears unremarkable. There is no evident free air or
portal venous air.

Vascular/Lymphatic: No abdominal aortic aneurysm. No vascular
lesions evident. No adenopathy appreciable in the abdomen or pelvis.

Reproductive: There are prostatic calculi. Prostate and seminal
vesicles are normal in size and contour. No evident pelvic mass.

Other: No periappendiceal region inflammatory change. No abscess or
ascites in the abdomen or pelvis. There is mild fat in the
umbilicus.

Musculoskeletal: No blastic or lytic bone lesions evident. No
intramuscular or abdominal wall lesions.

Review of the MIP images confirms the above findings.
IMPRESSION: CT angiogram chest:

1. No demonstrable pulmonary embolus. No thoracic aortic aneurysm or
dissection.
2. Multifocal airspace disease consistent with extensive bilateral
multifocal pneumonia. Mixture of consolidation and ground-glass type
opacities.
3. No evident adenopathy by size criteria.
4. Nodule in the right lobe of the thyroid measuring 1.8 x 1.5 cm.
Nonemergent thyroid ultrasound advised given this finding.
5.

CT abdomen and pelvis:

1. There is a degree of bowel malrotation. No bowel dilatation or
bowel obstruction. No abscess in the abdomen or pelvis. No internal
hernia evident.

2. No evident renal or ureteral calculus. No hydronephrosis. Urinary
bladder wall thickness normal. There are prostatic calculi.

## 2021-05-02 DIAGNOSIS — R0602 Shortness of breath: Secondary | ICD-10-CM | POA: Diagnosis not present

## 2021-05-02 DIAGNOSIS — I483 Typical atrial flutter: Secondary | ICD-10-CM | POA: Diagnosis not present

## 2021-05-02 DIAGNOSIS — I4819 Other persistent atrial fibrillation: Secondary | ICD-10-CM | POA: Diagnosis not present

## 2021-05-03 DIAGNOSIS — R0602 Shortness of breath: Secondary | ICD-10-CM | POA: Diagnosis not present

## 2021-05-19 DIAGNOSIS — Z20822 Contact with and (suspected) exposure to covid-19: Secondary | ICD-10-CM | POA: Diagnosis not present

## 2021-07-13 DIAGNOSIS — G4733 Obstructive sleep apnea (adult) (pediatric): Secondary | ICD-10-CM | POA: Diagnosis not present

## 2021-07-13 DIAGNOSIS — I4819 Other persistent atrial fibrillation: Secondary | ICD-10-CM | POA: Diagnosis not present

## 2021-07-18 DIAGNOSIS — B079 Viral wart, unspecified: Secondary | ICD-10-CM | POA: Diagnosis not present

## 2021-07-18 DIAGNOSIS — Z23 Encounter for immunization: Secondary | ICD-10-CM | POA: Diagnosis not present

## 2021-08-09 DIAGNOSIS — B079 Viral wart, unspecified: Secondary | ICD-10-CM | POA: Diagnosis not present

## 2021-08-17 DIAGNOSIS — Z03818 Encounter for observation for suspected exposure to other biological agents ruled out: Secondary | ICD-10-CM | POA: Diagnosis not present

## 2021-08-17 DIAGNOSIS — J069 Acute upper respiratory infection, unspecified: Secondary | ICD-10-CM | POA: Diagnosis not present

## 2021-09-06 DIAGNOSIS — B079 Viral wart, unspecified: Secondary | ICD-10-CM | POA: Diagnosis not present

## 2021-09-11 DIAGNOSIS — G4719 Other hypersomnia: Secondary | ICD-10-CM | POA: Diagnosis not present

## 2021-09-11 DIAGNOSIS — G4733 Obstructive sleep apnea (adult) (pediatric): Secondary | ICD-10-CM | POA: Diagnosis not present

## 2021-09-11 DIAGNOSIS — Z9989 Dependence on other enabling machines and devices: Secondary | ICD-10-CM | POA: Diagnosis not present

## 2021-09-11 DIAGNOSIS — I4819 Other persistent atrial fibrillation: Secondary | ICD-10-CM | POA: Diagnosis not present

## 2021-09-14 DIAGNOSIS — I483 Typical atrial flutter: Secondary | ICD-10-CM | POA: Diagnosis not present

## 2021-09-14 DIAGNOSIS — I4819 Other persistent atrial fibrillation: Secondary | ICD-10-CM | POA: Diagnosis not present

## 2021-11-21 DIAGNOSIS — H903 Sensorineural hearing loss, bilateral: Secondary | ICD-10-CM | POA: Diagnosis not present

## 2021-11-26 DIAGNOSIS — E785 Hyperlipidemia, unspecified: Secondary | ICD-10-CM | POA: Diagnosis not present

## 2021-11-26 DIAGNOSIS — Z125 Encounter for screening for malignant neoplasm of prostate: Secondary | ICD-10-CM | POA: Diagnosis not present

## 2021-11-26 DIAGNOSIS — Z0000 Encounter for general adult medical examination without abnormal findings: Secondary | ICD-10-CM | POA: Diagnosis not present

## 2022-03-25 ENCOUNTER — Encounter: Payer: Self-pay | Admitting: Internal Medicine

## 2022-03-25 ENCOUNTER — Ambulatory Visit: Payer: BC Managed Care – PPO | Admitting: Internal Medicine

## 2022-03-25 VITALS — BP 138/80 | HR 85 | Ht 72.0 in | Wt 280.0 lb

## 2022-03-25 DIAGNOSIS — E041 Nontoxic single thyroid nodule: Secondary | ICD-10-CM

## 2022-03-25 NOTE — Progress Notes (Addendum)
Patient ID: Terry Delgado, male   DOB: April 17, 1966, 56 y.o.   MRN: [id number]    HPI  Terry Delgado is a 56 y.o.-year-old very pleasant male, initially referred by his PCP, Dr. Cliffton Asters, returning for f/u for a right thyroid nodule.  Interim history: He has a URI 2 mo ago >> resolved, but still having drainage. Now working days again  - started 08/2021. He recently started working out at [employer]  Reviewed history: Patient was found to have a thyroid nodule incidentally on the CT chest obtained in the setting of COVID-19 infection complicated by pneumonia. A Thyroid U/S (07/30/2019) showed a right thyroid nodule, measuring 2.2 x 1.6 x 1.4 cm.  A repeat ultrasound (10/26/2020) showed an increase in size.  Biopsy (11/16/2020) was inconclusive due to scant material.  Thyroid U/S (10/26/2020): Right mid thyroid nodule measuring 2.7 cm (increased from 2.2 cm): Parenchymal Echotexture: Mildly heterogenous Isthmus: 4 mm Right lobe: 5.4 x 2.0 x 1.9 cm Left lobe: 4.8 x 1.4 x 1.4 cm  _________________________________________________________   Nodule # 1: Location: Right; Mid Maximum size: 2.7, previously 2.2 cm; Other 2 dimensions: 1.7 x 1.7 cm Composition: solid/almost completely solid (2) Echogenicity: isoechoic (1) **Given size (>/= 2.5 cm) and appearance, fine needle aspiration of this mildly suspicious nodule should be considered based on TI-RADS criteria. _________________________________________________________   No other significant thyroid abnormality. No hypervascularity. No regional adenopathy.   IMPRESSION: 2.7 cm right mid thyroid TR 3 nodule now meets criteria for biopsy as above.      FNA (11/16/2020): Scant follicular epithelial cells present (Bethesda category 1)  Pt denies: - feeling nodules in neck - hoarseness - dysphagia - choking - SOB with lying down  I reviewed pt's thyroid tests: 11/26/2021: TSH 2.41 11/21/2020: TSH 2.10 Lab Results  Component Value Date   TSH  1.512 07/10/2019    Pt mentions: - fatigue, especially after starting working nights >> now working days  + FH of thyroid ds. In father. No FH of thyroid cancer. No h/o radiation tx to head or neck. No steroid use. No herbal supplements. No Biotin supplements or Hair, Skin and Nails vitamins. On MVI.  Pt also has a history of OSA - on CPAP; Afib/flutter - s/p ablation - on ASA 81; Covid19 06/2019.  He is  working nights.   ROS: + See HPI   Past Medical History:  Diagnosis Date   A-fib (HCC)    Allergy    GERD (gastroesophageal reflux disease)    Hypertension    Past Surgical History:  Procedure Laterality Date   STOMACH SURGERY     infant   Social History   Socioeconomic History   Marital status: Married    Spouse name: Not on file   Number of children: 2   Years of education: Not on file   Highest education level: Not on file  Occupational History   Occupation: Chief of Staff  Tobacco Use   Smoking status: Never   Smokeless tobacco: Never  Vaping Use   Vaping Use: Never used  Substance and Sexual Activity   Alcohol use: Yes    Alcohol/week: 0.0 standard drinks of alcohol    Comment: Beer and wine-3 drinks weekly   Drug use: No   Sexual activity: Not on file  Other Topics Concern   Not on file  Social History Narrative   Not on file   Social Determinants of Health   Financial Resource Strain: Not on file  Food Insecurity: Not on  file  Transportation Needs: Not on file  Physical Activity: Not on file  Stress: Not on file  Social Connections: Not on file  Intimate Partner Violence: Not on file   Current Outpatient Medications on File Prior to Visit  Medication Sig Dispense Refill   aspirin EC 81 MG tablet Take 81 mg by mouth daily.     Chlorpheniramine Maleate (ALLERGY PO) Take by mouth.     Multiple Vitamin (MULTI-VITAMIN DAILY PO) Take by mouth.     No current facility-administered medications on file prior to visit.   Allergies  Allergen  Reactions   Penicillins Rash    Has patient had a PCN reaction causing immediate rash, facial/tongue/throat swelling, SOB or lightheadedness with hypotension: unknown Has patient had a PCN reaction causing severe rash involving mucus membranes or skin necrosis: No Has patient had a PCN reaction that required hospitalization No Has patient had a PCN reaction occurring within the last 10 years: No If all of the above answers are "NO", then may proceed with Cephalosporin use.   Family History  Problem Relation Age of Onset   Cancer Mother    Hypertension Mother    Hypertension Father    Diabetes Father    Hyperlipidemia Father    Heart disease Father    Thyroid disease Father    COPD Brother    Diabetes Brother    Depression Brother     PE: BP 138/80 (BP Location: Right Arm, Patient Position: Sitting, Cuff Size: Normal)   Pulse 85   Ht 6' (1.829 m)   Wt 280 lb (127 kg)   SpO2 95%   BMI 37.97 kg/m  Wt Readings from Last 3 Encounters:  03/25/22 280 lb (127 kg)  01/12/21 277 lb (125.6 kg)  07/09/19 260 lb 5.8 oz (118.1 kg)   Constitutional: overweight, in NAD Eyes: PERRLA, EOMI, no exophthalmos ENT: moist mucous membranes, no thyromegaly, no thyroid nodules palpable; no cervical lymphadenopathy Cardiovascular: RRR, No MRG Respiratory: CTA B Musculoskeletal: no deformities Skin: moist, warm, no rashes Neurological: no tremor with outstretched hands  ASSESSMENT: 1.  Right mid lobe thyroid nodule  PLAN: 1.  Right mid lobe thyroid nodule -I reviewed the report and the images of his thyroid ultrasound from 2022: The dominant nodule was large, this being a risk factor for cancer.  However, the nodule is: -Not hypoechoic -Without microcalcifications -Without internal blood flow -More wide than tall -Without invaginations in the surrounding tissue -Patient does not have a thyroid cancer family history or personal history of radiation therapy to head or neck to increase his  risk of thyroid cancer -The nodule was biopsied previously, but the results were inconclusive due to scant material.  At last visit, we discussed that, in this case, a repeat biopsy is indicated in 6 to 12 months to allow the previous biopsy sites to heal.  Since the nodule appears to be low risk based on ultrasound characteristics, except for size, we discussed about repeating the ultrasound in a year.  I did advise him at last visit to get in touch with me in 09/2021 to order the ultrasound, but he forgot. -At today's visit, patient does not complain of neck compression sxs and the nodule is still not palpable on physical exam -We will proceed with a new thyroid ultrasound.  After the results are back, we probably will go ahead and repeat the biopsy - if the new biopsy is benign, we can just continue to follow him on a yearly basis  with ultrasounds after another year or 2 - if the new biopsy still inconclusive, we may need to proceed with thyroid lobectomy for clarification of diagnosis.  Another option would be to continue to follow the nodule and only intervene if nodule changes U/S characteristics over time - if the new biopsy shows malignancy, I will refer her him to surgery for thyroidectomy - however, thyroid cancer is usually an indolent cancer with good prognosis - I'll see him back in 1 year or possibly sooner, depending on the ultrasound/Bx results.  Orders Placed This Encounter  Procedures   US THYROID   Thyroid U/S (03/28/2022): Parenchymal Echotexture: Mildly heterogenous  Isthmus: 0.6 cm  Right lobe: 5.0 x 2.6 x 2.2 cm  Left lobe: 5.0 x 1.2 x 1.7 cm _________________________________________________________   Estimated total number of nodules >/= 1 cm: 1 _________________________________________________________   The previously biopsied nodule in the right mid gland remains solid and slightly hyperechoic. The nodule is slightly enlarged at 3.3 x 2.2 x 2.1 cm compared to 2.7 x  1.7 x 1.7 cm previously.   No new nodules or suspicious features are identified.   IMPRESSION: Slow enlargement of previously biopsied nodule in the right mid gland. The lesion now measures 3.3 x 2.2 x 2.1 cm compared to 2.7 x 1.7 x 1.7 cm in February of 2022. Recommend correlation with prior biopsy results.   No new nodules or suspicious features.  The right thyroid nodule appears to be slightly larger.  We will try again to biopsy this nodule.  FNA (04/12/2022): Clinical History: The previously biopsied nodule in the right mid gland  remains solid and slightly hyper echoic, The nodule is slightly enlarged  at 3.3 x 2.2 x 2.1cm compared to 2.7 x 1.7 x 1.7cm previously,  3/22-BETHSEDA 1  Specimen Submitted:  A. THYROID, RT MID, FINE NEEDLE ASPIRATION:    FINAL MICROSCOPIC DIAGNOSIS:  - Nondiagnostic material   SPECIMEN ADEQUACY:  INSUFFICIENT FOR DIAGNOSIS. The specimen is processed and examined  microscopically, but found to be UNSATISFACTORY for evaluation.   DIAGNOSTIC COMMENTS:  This specimen is extremely hypocellular.   Unfortunately, the new biopsy still inconclusive due to insufficient sample.  At this point, we have 2 options: Right lobectomy and continuing to follow the nodule via ultrasound.  I will discuss with the patient.  This nodule is large, but otherwise, it is hyperechoic and without any other concerning features.  Carlus Pavlov, MD PhD [facility] End[facility]

## 2022-03-25 NOTE — Patient Instructions (Signed)
Will schedule a new thyroid U/S for you.  You should have an endocrinology follow-up appointment in 1 year.

## 2022-03-28 ENCOUNTER — Ambulatory Visit
Admission: RE | Admit: 2022-03-28 | Discharge: 2022-03-28 | Disposition: A | Discharge status: Home / Self Care | Payer: BC Managed Care – PPO | Source: Ambulatory Visit | Attending: Internal Medicine | Admitting: Internal Medicine

## 2022-03-28 DIAGNOSIS — E041 Nontoxic single thyroid nodule: Secondary | ICD-10-CM

## 2022-03-28 DIAGNOSIS — E049 Nontoxic goiter, unspecified: Secondary | ICD-10-CM | POA: Diagnosis not present

## 2022-03-29 NOTE — Addendum Note (Signed)
Addended by: Carlus Pavlov on: 03/29/2022 11:23 AM   Modules accepted: Orders

## 2022-04-11 ENCOUNTER — Other Ambulatory Visit (HOSPITAL_COMMUNITY)
Admission: RE | Admit: 2022-04-11 | Discharge: 2022-04-11 | Disposition: A | Discharge status: Home / Self Care | Payer: BC Managed Care – PPO | Source: Ambulatory Visit | Attending: Diagnostic Radiology | Admitting: Diagnostic Radiology

## 2022-04-11 ENCOUNTER — Ambulatory Visit
Admission: RE | Admit: 2022-04-11 | Discharge: 2022-04-11 | Disposition: A | Discharge status: Home / Self Care | Payer: BC Managed Care – PPO | Source: Ambulatory Visit | Attending: Internal Medicine | Admitting: Internal Medicine

## 2022-04-11 DIAGNOSIS — E041 Nontoxic single thyroid nodule: Secondary | ICD-10-CM | POA: Insufficient documentation

## 2022-04-12 ENCOUNTER — Other Ambulatory Visit: Payer: BC Managed Care – PPO

## 2022-04-12 LAB — CYTOLOGY - NON PAP

## 2022-05-16 DIAGNOSIS — Z03818 Encounter for observation for suspected exposure to other biological agents ruled out: Secondary | ICD-10-CM | POA: Diagnosis not present

## 2022-05-16 DIAGNOSIS — J069 Acute upper respiratory infection, unspecified: Secondary | ICD-10-CM | POA: Diagnosis not present

## 2022-05-16 DIAGNOSIS — R051 Acute cough: Secondary | ICD-10-CM | POA: Diagnosis not present

## 2022-07-22 DIAGNOSIS — H109 Unspecified conjunctivitis: Secondary | ICD-10-CM | POA: Diagnosis not present

## 2022-07-22 DIAGNOSIS — H6691 Otitis media, unspecified, right ear: Secondary | ICD-10-CM | POA: Diagnosis not present

## 2022-07-22 DIAGNOSIS — R058 Other specified cough: Secondary | ICD-10-CM | POA: Diagnosis not present

## 2022-08-15 DIAGNOSIS — U071 COVID-19: Secondary | ICD-10-CM | POA: Diagnosis not present

## 2022-09-16 DIAGNOSIS — I483 Typical atrial flutter: Secondary | ICD-10-CM | POA: Diagnosis not present

## 2022-09-16 DIAGNOSIS — I4819 Other persistent atrial fibrillation: Secondary | ICD-10-CM | POA: Diagnosis not present

## 2022-09-17 DIAGNOSIS — I480 Paroxysmal atrial fibrillation: Secondary | ICD-10-CM | POA: Diagnosis not present

## 2022-09-17 DIAGNOSIS — G4733 Obstructive sleep apnea (adult) (pediatric): Secondary | ICD-10-CM | POA: Diagnosis not present

## 2022-09-17 DIAGNOSIS — R058 Other specified cough: Secondary | ICD-10-CM | POA: Diagnosis not present

## 2022-09-25 ENCOUNTER — Ambulatory Visit
Admission: RE | Admit: 2022-09-25 | Discharge: 2022-09-25 | Disposition: A | Discharge status: Home / Self Care | Payer: BC Managed Care – PPO | Source: Ambulatory Visit | Attending: Family Medicine | Admitting: Family Medicine

## 2022-09-25 ENCOUNTER — Other Ambulatory Visit: Payer: Self-pay | Admitting: Family Medicine

## 2022-09-25 DIAGNOSIS — R059 Cough, unspecified: Secondary | ICD-10-CM | POA: Diagnosis not present

## 2022-09-25 DIAGNOSIS — R058 Other specified cough: Secondary | ICD-10-CM

## 2022-11-26 DIAGNOSIS — I480 Paroxysmal atrial fibrillation: Secondary | ICD-10-CM | POA: Diagnosis not present

## 2022-11-26 DIAGNOSIS — R002 Palpitations: Secondary | ICD-10-CM | POA: Diagnosis not present

## 2022-12-12 DIAGNOSIS — Z0000 Encounter for general adult medical examination without abnormal findings: Secondary | ICD-10-CM | POA: Diagnosis not present

## 2022-12-12 DIAGNOSIS — Z125 Encounter for screening for malignant neoplasm of prostate: Secondary | ICD-10-CM | POA: Diagnosis not present

## 2022-12-12 DIAGNOSIS — E785 Hyperlipidemia, unspecified: Secondary | ICD-10-CM | POA: Diagnosis not present

## 2022-12-12 DIAGNOSIS — Z1159 Encounter for screening for other viral diseases: Secondary | ICD-10-CM | POA: Diagnosis not present

## 2023-01-30 DIAGNOSIS — R509 Fever, unspecified: Secondary | ICD-10-CM | POA: Diagnosis not present

## 2023-01-30 DIAGNOSIS — R0609 Other forms of dyspnea: Secondary | ICD-10-CM | POA: Diagnosis not present

## 2023-01-30 DIAGNOSIS — J189 Pneumonia, unspecified organism: Secondary | ICD-10-CM | POA: Diagnosis not present

## 2023-01-30 DIAGNOSIS — Z03818 Encounter for observation for suspected exposure to other biological agents ruled out: Secondary | ICD-10-CM | POA: Diagnosis not present

## 2023-01-30 DIAGNOSIS — R051 Acute cough: Secondary | ICD-10-CM | POA: Diagnosis not present

## 2023-03-21 DIAGNOSIS — E785 Hyperlipidemia, unspecified: Secondary | ICD-10-CM | POA: Diagnosis not present

## 2023-03-31 ENCOUNTER — Ambulatory Visit: Payer: BC Managed Care – PPO | Admitting: Internal Medicine

## 2023-03-31 ENCOUNTER — Encounter: Payer: Self-pay | Admitting: Internal Medicine

## 2023-03-31 VITALS — BP 138/80 | HR 83 | Ht 72.0 in | Wt 280.0 lb

## 2023-03-31 DIAGNOSIS — E041 Nontoxic single thyroid nodule: Secondary | ICD-10-CM | POA: Diagnosis not present

## 2023-03-31 NOTE — Patient Instructions (Signed)
Will schedule a new thyroid U/S for you.  You should have an endocrinology follow-up appointment in 2 years.

## 2023-03-31 NOTE — Progress Notes (Addendum)
Patient ID: Terry Delgado, male   DOB: 1965-10-18, 57 y.o.   MRN: [id number]    HPI  Terry Delgado is a 57 y.o.-year-old very pleasant male, initially referred by his PCP, Dr. Cliffton Asters, returning for f/u for a right thyroid nodule.  Last visit 1 year ago.  Interim history: He started working out at [employer] before last OV, after starting to work days. He had COVID-19 infection again in 07/2022 and then pneumonia in 12/2022.  No persistent symptoms. He feels well, without complaints today.  Reviewed history: Patient was found to have a thyroid nodule incidentally on the CT chest obtained in the setting of COVID-19 infection complicated by pneumonia. A Thyroid U/S (07/30/2019) showed a right thyroid nodule, measuring 2.2 x 1.6 x 1.4 cm.  A repeat ultrasound (10/26/2020) showed an increase in size.  Biopsy (11/16/2020) was inconclusive due to scant material.  Thyroid U/S (10/26/2020): Right mid thyroid nodule measuring 2.7 cm (increased from 2.2 cm): Parenchymal Echotexture: Mildly heterogenous Isthmus: 4 mm Right lobe: 5.4 x 2.0 x 1.9 cm Left lobe: 4.8 x 1.4 x 1.4 cm  _________________________________________________________   Nodule # 1: Location: Right; Mid Maximum size: 2.7, previously 2.2 cm; Other 2 dimensions: 1.7 x 1.7 cm Composition: solid/almost completely solid (2) Echogenicity: isoechoic (1) **Given size (>/= 2.5 cm) and appearance, fine needle aspiration of this mildly suspicious nodule should be considered based on TI-RADS criteria. _________________________________________________________   No other significant thyroid abnormality. No hypervascularity. No regional adenopathy.   IMPRESSION: 2.7 cm right mid thyroid TR 3 nodule now meets criteria for biopsy as above.      FNA (11/16/2020): Scant follicular epithelial cells present (Bethesda category 1)  Thyroid U/S (03/28/2022): Parenchymal Echotexture: Mildly heterogenous  Isthmus: 0.6 cm  Right lobe: 5.0 x 2.6 x 2.2 cm   Left lobe: 5.0 x 1.2 x 1.7 cm _________________________________________________________   Estimated total number of nodules >/= 1 cm: 1 _________________________________________________________   The previously biopsied nodule in the right mid gland remains solid and slightly hyperechoic. The nodule is slightly enlarged at 3.3 x 2.2 x 2.1 cm compared to 2.7 x 1.7 x 1.7 cm previously.   No new nodules or suspicious features are identified.   IMPRESSION: Slow enlargement of previously biopsied nodule in the right mid gland. The lesion now measures 3.3 x 2.2 x 2.1 cm compared to 2.7 x 1.7 x 1.7 cm in February of 2022. Recommend correlation with prior biopsy results.   No new nodules or suspicious features.  The right thyroid nodule appeared to be slightly larger. Bx attempted:  FNA (04/12/2022): FINAL MICROSCOPIC DIAGNOSIS:  - Nondiagnostic material   SPECIMEN ADEQUACY:  INSUFFICIENT FOR DIAGNOSIS. The specimen is processed and examined  microscopically, but found to be UNSATISFACTORY for evaluation.   DIAGNOSTIC COMMENTS:  This specimen is extremely hypocellular.   Unfortunately, the new biopsy still inconclusive due to insufficient sample.  At this point, we have 2 options: Right lobectomy and continuing to follow the nodule via ultrasound.  I will discuss with the patient.  This nodule is large, but otherwise, it is hyperechoic and without any other concerning features.  Pt denies: - feeling nodules in neck - hoarseness - dysphagia - choking  I reviewed pt's thyroid tests: 11/26/2021: TSH 2.41 11/21/2020: TSH 2.10 Lab Results  Component Value Date   TSH 1.512 07/10/2019    + FH of thyroid ds. In father. No FH of thyroid cancer. No h/o radiation tx to head or neck. No steroid use.  No herbal supplements. No Biotin supplements or Hair, Skin and Nails vitamins. On MVI.  Pt also has a history of OSA - on CPAP; Afib/flutter - s/p ablation - on ASA 81; Covid19  06/2019.  He is  working nights.   ROS: + See HPI   Past Medical History:  Diagnosis Date   A-fib (HCC)    Allergy    GERD (gastroesophageal reflux disease)    Hypertension    Past Surgical History:  Procedure Laterality Date   STOMACH SURGERY     infant   Social History   Socioeconomic History   Marital status: Married    Spouse name: Not on file   Number of children: 2   Years of education: Not on file   Highest education level: Not on file  Occupational History   Occupation: Chief of Staff  Tobacco Use   Smoking status: Never   Smokeless tobacco: Never  Vaping Use   Vaping status: Never Used  Substance and Sexual Activity   Alcohol use: Yes    Alcohol/week: 0.0 standard drinks of alcohol    Comment: Beer and wine-3 drinks weekly   Drug use: No   Sexual activity: Not on file  Other Topics Concern   Not on file  Social History Narrative   Not on file   Social Determinants of Health   Financial Resource Strain: Low Risk  (05/02/2021)   Received from [facility], [facility]   Overall Financial Resource Strain (CARDIA)    Difficulty of Paying Living Expenses: Not very hard  Food Insecurity: No Food Insecurity (09/11/2021)   Received from [facility], [facility]   Hunger Vital Sign    Worried About Running Out of Food in the Last Year: Never true    Ran Out of Food in the Last Year: Never true  Transportation Needs: No Transportation Needs (05/02/2021)   Received from [facility], [facility]   PRAPARE - Transportation    Lack of Transportation (Medical): No    Lack of Transportation (Non-Medical): No  Physical Activity: Inactive (05/02/2021)   Received from [facility], [facility]   Exercise Vital Sign    Days of Exercise per Week: 0 days    Minutes of Exercise per Session: 0 min  Stress: Stress Concern Present (05/02/2021)   Received from [facility], [facility] Occupational Health - Occupational Stress  Questionnaire    Feeling of Stress : To some extent  Social Connections: Unknown (01/21/2022)   Received from [facility], [facility]   Social Network    Social Network: Not on file  Intimate Partner Violence: Unknown (12/13/2021)   Received from [facility], [facility]   HITS    Physically Hurt: Not on file    Insult or Talk Down To: Not on file    Threaten Physical Harm: Not on file    Scream or Curse: Not on file   Current Outpatient Medications on File Prior to Visit  Medication Sig Dispense Refill   aspirin EC 81 MG tablet Take 81 mg by mouth daily.     Chlorpheniramine Maleate (ALLERGY PO) Take by mouth.     Multiple Vitamin (MULTI-VITAMIN DAILY PO) Take by mouth.     No current facility-administered medications on file prior to visit.   Allergies  Allergen Reactions   Penicillins Rash    Has patient had a PCN reaction causing immediate rash, facial/tongue/throat swelling, SOB or lightheadedness with hypotension: unknown Has patient had  a PCN reaction causing severe rash involving mucus membranes or skin necrosis: No Has patient had a PCN reaction that required hospitalization No Has patient had a PCN reaction occurring within the last 10 years: No If all of the above answers are "NO", then may proceed with Cephalosporin use.   Family History  Problem Relation Age of Onset   Cancer Mother    Hypertension Mother    Hypertension Father    Diabetes Father    Hyperlipidemia Father    Heart disease Father    Thyroid disease Father    COPD Brother    Diabetes Brother    Depression Brother    PE: BP 138/80   Pulse 83   Ht 6' (1.829 m)   Wt 280 lb (127 kg)   SpO2 97%   BMI 37.97 kg/m FNA Wt Readings from Last 3 Encounters:  03/31/23 280 lb (127 kg)  03/25/22 280 lb (127 kg)  01/12/21 277 lb (125.6 kg)   Constitutional: overweight, in NAD Eyes: EOMI, no exophthalmos ENT: no thyromegaly, no thyroid nodules palpable, but mild right thyroid fullness  palpated; no cervical lymphadenopathy Cardiovascular: RRR, No MRG Respiratory: CTA B Musculoskeletal: no deformities Skin: no rashes Neurological: no tremor with outstretched hands  ASSESSMENT: 1.  Right mid lobe thyroid nodule  PLAN: 1.  Right mid lobe thyroid nodule -Patient has a history of fibroids dominant thyroid nodule, but not hypoechoic, without microcalcifications, without internal blood flow, more wide than tall and without irregular margins.  The nodule did increase on the latest ultrasound from 09/2021 from 2.7 cm to 3.3 cm in the largest dimension.  However, other than size, the nodule does not have other worrisome features.  Also, he does not have a thyroid cancer or family history or personal history of radiation therapy to head or neck to increase his risk of thyroid cancer -We tried to biopsy the nodule twice, in 11/2020 and 04/2022, but the results were inconclusive due to scant material.  In this case, we discussed that we have 2 options: Right thyroid lobectomy versus close follow-up.  He opted for follow-up. -At today's visit, he does not have neck compression symptoms and the nodule is not palpable on physical exam -At today's visit, we will check another thyroid ultrasound.  If the nodule continues to grow, we may need right lobectomy.  If the nodule has not significantly grown, plan to repeat another ultrasound in 2 years.  She agrees with this plan. -We did discuss the thyroid cancer is usually an indolent cancer, with good prognosis -Reviewed latest TSH which was normal in 2023 -I will see him back in 2 years, however, possibly sooner, depending on the ultrasound results.  Orders Placed This Encounter  Procedures   US THYROID   Thyroid ultrasound (04/11/2023): Parenchymal Echotexture: Mildly heterogenous  Isthmus: 0.5 cm  Right lobe: 0.0 cm x 2.7 cm x 2.8 cm  Left lobe: 4.9 cm x 1.5 cm x 1.4 cm _________________________________________________________    Estimated total number of nodules >/= 1 cm: 1  _________________________________________________________   Nodule labeled 2 in the right thyroid, 4.1 cm and TR 3 characteristics. Nodule has undergone biopsy on 2 prior occasions, 11/16/2020 and 04/11/2022. Assuming benign result no further specific follow-up would be indicated.   Nodule labeled 1, in the right isthmus, 8 mm. Nodule does not meet criteria for surveillance.   No adenopathy   IMPRESSION: Similar appearance of the right-sided nodule (labeled 2) which has undergone biopsy on 2 prior  occasion. Assuming benign result no further specific follow-up would be indicated.  The right thyroid nodule continues to enlarge.  It does not have concerning features.  He had 2 previous nondiagnostic biopsies.  Would suggest to check another ultrasound in a year and if the nodule is larger, I would likely suggest thyroid lobectomy.  Carlus Pavlov, MD PhD [facility] Endocrinology

## 2023-04-11 ENCOUNTER — Ambulatory Visit: Admission: RE | Admit: 2023-04-11 | Payer: BC Managed Care – PPO | Source: Ambulatory Visit

## 2023-04-11 DIAGNOSIS — E041 Nontoxic single thyroid nodule: Secondary | ICD-10-CM

## 2023-06-03 DIAGNOSIS — R051 Acute cough: Secondary | ICD-10-CM | POA: Diagnosis not present

## 2023-06-03 DIAGNOSIS — R509 Fever, unspecified: Secondary | ICD-10-CM | POA: Diagnosis not present

## 2023-06-03 DIAGNOSIS — Z03818 Encounter for observation for suspected exposure to other biological agents ruled out: Secondary | ICD-10-CM | POA: Diagnosis not present

## 2023-06-03 DIAGNOSIS — J101 Influenza due to other identified influenza virus with other respiratory manifestations: Secondary | ICD-10-CM | POA: Diagnosis not present

## 2023-06-10 DIAGNOSIS — E785 Hyperlipidemia, unspecified: Secondary | ICD-10-CM | POA: Diagnosis not present

## 2023-06-10 DIAGNOSIS — Z6838 Body mass index (BMI) 38.0-38.9, adult: Secondary | ICD-10-CM | POA: Diagnosis not present

## 2023-06-10 DIAGNOSIS — E669 Obesity, unspecified: Secondary | ICD-10-CM | POA: Diagnosis not present

## 2023-07-21 DIAGNOSIS — J329 Chronic sinusitis, unspecified: Secondary | ICD-10-CM | POA: Diagnosis not present

## 2023-07-21 DIAGNOSIS — J209 Acute bronchitis, unspecified: Secondary | ICD-10-CM | POA: Diagnosis not present

## 2023-07-21 DIAGNOSIS — R053 Chronic cough: Secondary | ICD-10-CM | POA: Diagnosis not present

## 2023-07-21 DIAGNOSIS — R0981 Nasal congestion: Secondary | ICD-10-CM | POA: Diagnosis not present

## 2023-08-23 DIAGNOSIS — J069 Acute upper respiratory infection, unspecified: Secondary | ICD-10-CM | POA: Diagnosis not present

## 2023-08-23 DIAGNOSIS — H1089 Other conjunctivitis: Secondary | ICD-10-CM | POA: Diagnosis not present

## 2023-08-23 DIAGNOSIS — Z03818 Encounter for observation for suspected exposure to other biological agents ruled out: Secondary | ICD-10-CM | POA: Diagnosis not present

## 2023-08-25 DIAGNOSIS — G4733 Obstructive sleep apnea (adult) (pediatric): Secondary | ICD-10-CM | POA: Diagnosis not present

## 2023-08-25 DIAGNOSIS — Z6838 Body mass index (BMI) 38.0-38.9, adult: Secondary | ICD-10-CM | POA: Diagnosis not present

## 2023-08-25 DIAGNOSIS — E785 Hyperlipidemia, unspecified: Secondary | ICD-10-CM | POA: Diagnosis not present

## 2023-08-25 DIAGNOSIS — E669 Obesity, unspecified: Secondary | ICD-10-CM | POA: Diagnosis not present

## 2023-10-15 ENCOUNTER — Encounter: Payer: Self-pay | Admitting: Podiatry

## 2023-10-15 ENCOUNTER — Ambulatory Visit (INDEPENDENT_AMBULATORY_CARE_PROVIDER_SITE_OTHER): Payer: BC Managed Care – PPO | Admitting: Podiatry

## 2023-10-15 DIAGNOSIS — B070 Plantar wart: Secondary | ICD-10-CM | POA: Diagnosis not present

## 2023-10-16 NOTE — Progress Notes (Signed)
 Subjective:   Patient ID: Terry Delgado, male   DOB: 58 y.o.   MRN: [id number]   HPI Patient presents with plantar lesion left that popped up over the last couple months and has been very sore.  He has had a history of these and the last one was approximately 7 years ago and he feels like this is in a different spot and it has been very tender.  Patient does not smoke likes to be active   Review of Systems  All other systems reviewed and are negative.       Objective:  Physical Exam Vitals and nursing note reviewed.  Constitutional:      Appearance: He is well-developed.  Pulmonary:     Effort: Pulmonary effort is normal.  Musculoskeletal:        General: Normal range of motion.  Skin:    General: Skin is warm.  Neurological:     Mental Status: He is alert.     Neurovascular status intact muscle strength was found to be adequate range of motion within normal limits with exquisite discomfort in the plantar aspect of the left heel that upon debridement shows pinpoint bleeding but most of it is below the service versus anything I was able to see on the surface     Assessment:  Problem velvety for verruca plantaris plantar left that is in a somewhat unusual pattern     Plan:  H&P reviewed sterile sharp debridement of the area and I then applied chemical agent to create immune response with sterile dressing gave instructions on what to do if blistering were to occur and may require retreatment other treatment or possibly excision depending on response with all discussion commenced

## 2023-11-11 DIAGNOSIS — R509 Fever, unspecified: Secondary | ICD-10-CM | POA: Diagnosis not present

## 2023-11-11 DIAGNOSIS — J029 Acute pharyngitis, unspecified: Secondary | ICD-10-CM | POA: Diagnosis not present

## 2023-11-11 DIAGNOSIS — Z03818 Encounter for observation for suspected exposure to other biological agents ruled out: Secondary | ICD-10-CM | POA: Diagnosis not present

## 2023-11-11 DIAGNOSIS — J988 Other specified respiratory disorders: Secondary | ICD-10-CM | POA: Diagnosis not present

## 2023-12-18 DIAGNOSIS — Z125 Encounter for screening for malignant neoplasm of prostate: Secondary | ICD-10-CM | POA: Diagnosis not present

## 2023-12-18 DIAGNOSIS — E785 Hyperlipidemia, unspecified: Secondary | ICD-10-CM | POA: Diagnosis not present

## 2023-12-18 DIAGNOSIS — Z0000 Encounter for general adult medical examination without abnormal findings: Secondary | ICD-10-CM | POA: Diagnosis not present

## 2023-12-18 DIAGNOSIS — I480 Paroxysmal atrial fibrillation: Secondary | ICD-10-CM | POA: Diagnosis not present

## 2023-12-18 LAB — LAB REPORT - SCANNED: TSH: 1.85 (ref 0.41–5.90)

## 2023-12-24 DIAGNOSIS — I4819 Other persistent atrial fibrillation: Secondary | ICD-10-CM | POA: Diagnosis not present

## 2023-12-24 DIAGNOSIS — Z1330 Encounter for screening examination for mental health and behavioral disorders, unspecified: Secondary | ICD-10-CM | POA: Diagnosis not present

## 2023-12-24 DIAGNOSIS — I483 Typical atrial flutter: Secondary | ICD-10-CM | POA: Diagnosis not present

## 2024-02-09 ENCOUNTER — Ambulatory Visit: Admitting: Podiatry

## 2024-02-09 ENCOUNTER — Encounter: Payer: Self-pay | Admitting: Podiatry

## 2024-02-09 VITALS — Ht 72.0 in | Wt 280.0 lb

## 2024-02-09 DIAGNOSIS — M722 Plantar fascial fibromatosis: Secondary | ICD-10-CM

## 2024-02-09 DIAGNOSIS — B070 Plantar wart: Secondary | ICD-10-CM

## 2024-02-09 MED ORDER — TRIAMCINOLONE ACETONIDE 10 MG/ML IJ SUSP
10.0000 mg | Freq: Once | INTRAMUSCULAR | Status: AC
Start: 2024-02-09 — End: 2024-02-09
  Administered 2024-02-09: 10 mg via INTRA_ARTICULAR

## 2024-02-09 NOTE — Progress Notes (Signed)
 Subjective:   Patient ID: Terry Delgado, male   DOB: 58 y.o.   MRN: [id number]   HPI Patient states that he has developed pain in his left arch and also has a lesion in a slightly different area of the left arch.  States the other 1 has resolved this has become bothersome   ROS      Objective:  Physical Exam  Neurovascular status intact with inflammation of the mid arch area left fluid buildup and slightly proximal lesion formation that upon debridement shows pinpoint bleeding     Assessment:  Verruca plantaris plantar left along with plantar fasciitis of the mid band mid arch left     Plan:  H&P both conditions discussed sterile prep and injected the mid arch area left 3 mg Kenalog 5 mg Xylocaine and went ahead today and exposed the verruca and applied chemical agent to create immune response with sterile dressing and explained what to do if blistering were to occur.  Reappoint as symptoms indicate

## 2024-04-29 ENCOUNTER — Ambulatory Visit: Payer: BC Managed Care – PPO | Admitting: Internal Medicine

## 2024-07-05 ENCOUNTER — Ambulatory Visit: Admitting: Internal Medicine

## 2024-07-05 ENCOUNTER — Encounter: Payer: Self-pay | Admitting: Internal Medicine

## 2024-07-05 VITALS — BP 130/80 | HR 92 | Ht 72.0 in | Wt 284.8 lb

## 2024-07-05 DIAGNOSIS — E041 Nontoxic single thyroid nodule: Secondary | ICD-10-CM

## 2024-07-05 DIAGNOSIS — Z23 Encounter for immunization: Secondary | ICD-10-CM

## 2024-07-05 NOTE — Progress Notes (Signed)
 Patient ID: Terry Delgado, male   DOB: 08/01/1966, 58 y.o.   MRN: [id number]    HPI  Terry Delgado is a 58 y.o.-year-old very pleasant male, initially referred by his PCP, Dr. Teresa, returning for f/u for a right thyroid  nodule.  Last visit 1 year ago.  Interim history: At today's visit, he feels well, however, few days ago, he started to feel the right side of his neck larger than before and he is worried that his thyroid  nodule has grown.  No dysphagia, choking, or shortness of breath.  Reviewed and addended history: Patient was found to have a thyroid  nodule incidentally on the CT chest obtained in the setting of COVID-19 infection complicated by pneumonia. A Thyroid  U/S (07/30/2019) showed a right thyroid  nodule, measuring 2.2 x 1.6 x 1.4 cm.  A repeat ultrasound (10/26/2020) showed an increase in size.  Biopsy (11/16/2020) was inconclusive due to scant material.  Thyroid  U/S (10/26/2020): Right mid thyroid  nodule measuring 2.7 cm (increased from 2.2 cm): Parenchymal Echotexture: Mildly heterogenous Isthmus: 4 mm Right lobe: 5.4 x 2.0 x 1.9 cm Left lobe: 4.8 x 1.4 x 1.4 cm  _________________________________________________________   Nodule # 1: Location: Right; Mid Maximum size: 2.7, previously 2.2 cm; Other 2 dimensions: 1.7 x 1.7 cm Composition: solid/almost completely solid (2) Echogenicity: isoechoic (1) **Given size (>/= 2.5 cm) and appearance, fine needle aspiration of this mildly suspicious nodule should be considered based on TI-RADS criteria. _________________________________________________________   No other significant thyroid  abnormality. No hypervascularity. No regional adenopathy.   IMPRESSION: 2.7 cm right mid thyroid  TR 3 nodule now meets criteria for biopsy as above.      FNA (11/16/2020): Scant follicular epithelial cells present (Bethesda category 1)  Thyroid  U/S (03/28/2022): Parenchymal Echotexture: Mildly heterogenous  Isthmus: 0.6 cm  Right lobe: 5.0 x 2.6  x 2.2 cm  Left lobe: 5.0 x 1.2 x 1.7 cm _________________________________________________________   Estimated total number of nodules >/= 1 cm: 1 _________________________________________________________   The previously biopsied nodule in the right mid gland remains solid and slightly hyperechoic. The nodule is slightly enlarged at 3.3 x 2.2 x 2.1 cm compared to 2.7 x 1.7 x 1.7 cm previously.   No new nodules or suspicious features are identified.   IMPRESSION: Slow enlargement of previously biopsied nodule in the right mid gland. The lesion now measures 3.3 x 2.2 x 2.1 cm compared to 2.7 x 1.7 x 1.7 cm in February of 2022. Recommend correlation with prior biopsy results.   No new nodules or suspicious features.  The right thyroid  nodule appeared to be slightly larger. Bx attempted:  FNA (04/12/2022): FINAL MICROSCOPIC DIAGNOSIS:  - Nondiagnostic material   SPECIMEN ADEQUACY:  INSUFFICIENT FOR DIAGNOSIS. The specimen is processed and examined  microscopically, but found to be UNSATISFACTORY for evaluation.   DIAGNOSTIC COMMENTS:  This specimen is extremely hypocellular.   Thyroid  ultrasound (04/11/2023): Parenchymal Echotexture: Mildly heterogenous  Isthmus: 0.5 cm  Right lobe: 0.0 cm x 2.7 cm x 2.8 cm  Left lobe: 4.9 cm x 1.5 cm x 1.4 cm _________________________________________________________   Estimated total number of nodules >/= 1 cm: 1  _________________________________________________________   Nodule labeled 2 in the right thyroid , 4.1 cm and TR 3 characteristics. Nodule has undergone biopsy on 2 prior occasions, 11/16/2020 and 04/11/2022. Assuming benign result no further specific follow-up would be indicated.   Nodule labeled 1, in the right isthmus, 8 mm. Nodule does not meet criteria for surveillance.   No adenopathy   IMPRESSION:  Similar appearance of the right-sided nodule (labeled 2) which has undergone biopsy on 2 prior occasion. Assuming benign  result no further specific follow-up would be indicated.  The right thyroid  nodules large, but otherwise, it is hyperechoic and without any other concerning features.  However,  It continues to enlarge. He had 2 previous nondiagnostic biopsies.   Pt denies: - feeling nodules in neck - hoarseness - dysphagia - choking  I reviewed pt's thyroid  tests: Lab Results  Component Value Date   TSH 1.85 12/18/2023   TSH 1.512 07/10/2019  11/26/2021: TSH 2.41 11/21/2020: TSH 2.10  + FH of thyroid  ds. In father (thyroidectomy). No FH of thyroid  cancer. No h/o radiation tx to head or neck. No steroid use. No herbal supplements. No Biotin. On MVI.  Pt also has a history of OSA - on CPAP; Afib/flutter - s/p ablation - on ASA 81; Covid19 06/2019.  ROS: + See HPI   Past Medical History:  Diagnosis Date   A-fib (HCC)    Allergy    GERD (gastroesophageal reflux disease)    Hypertension    Past Surgical History:  Procedure Laterality Date   STOMACH SURGERY     infant   Social History   Socioeconomic History   Marital status: Married    Spouse name: Not on file   Number of children: 2   Years of education: Not on file   Highest education level: Not on file  Occupational History   Occupation: Chief of staff  Tobacco Use   Smoking status: Never   Smokeless tobacco: Never  Vaping Use   Vaping status: Never Used  Substance and Sexual Activity   Alcohol use: Yes    Alcohol/week: 0.0 standard drinks of alcohol    Comment: Beer and wine-3 drinks weekly   Drug use: No   Sexual activity: Not on file  Other Topics Concern   Not on file  Social History Narrative   Not on file   Social Drivers of Health   Financial Resource Strain: Low Risk  (12/23/2023)   Received from [facility]   Overall Financial Resource Strain (CARDIA)    Difficulty of Paying Living Expenses: Not very hard  Food Insecurity: No Food Insecurity (12/23/2023)   Received from [facility]   Hunger Vital Sign     Worried About Running Out of Food in the Last Year: Never true    Ran Out of Food in the Last Year: Never true  Transportation Needs: No Transportation Needs (12/23/2023)   Received from [facility] - Transportation    Lack of Transportation (Medical): No    Lack of Transportation (Non-Medical): No  Physical Activity: Insufficiently Active (12/23/2023)   Received from [facility]   Exercise Vital Sign    Days of Exercise per Week: 2 days    Minutes of Exercise per Session: 20 min  Stress: Stress Concern Present (12/23/2023)   Received from [facility] - Occupational Stress Questionnaire    Feeling of Stress : To some extent  Social Connections: Socially Integrated (12/23/2023)   Received from [facility]   Social Network    How would you rate your social network (family, work, friends)?: Good participation with social networks  Intimate Partner Violence: Not At Risk (12/23/2023)   Received from [facility]   HITS    Over the last 12 months how often did your partner physically hurt you?: Never    Over the last  12 months how often did your partner insult you or talk down to you?: Never    Over the last 12 months how often did your partner threaten you with physical harm?: Never    Over the last 12 months how often did your partner scream or curse at you?: Never   Current Outpatient Medications on File Prior to Visit  Medication Sig Dispense Refill   aspirin  EC 81 MG tablet Take 81 mg by mouth daily.     Chlorpheniramine Maleate (ALLERGY PO) Take by mouth.     fexofenadine (ALLEGRA) 180 MG tablet Take 180 mg by mouth daily.     Multiple Vitamin (MULTI-VITAMIN DAILY PO) Take by mouth.     No current facility-administered medications on file prior to visit.   Allergies  Allergen Reactions   Penicillins Rash    Has patient had a PCN reaction causing immediate rash, facial/tongue/throat swelling, SOB or lightheadedness  with hypotension: unknown Has patient had a PCN reaction causing severe rash involving mucus membranes or skin necrosis: No Has patient had a PCN reaction that required hospitalization No Has patient had a PCN reaction occurring within the last 10 years: No If all of the above answers are NO, then may proceed with Cephalosporin use.   Family History  Problem Relation Age of Onset   Cancer Mother    Hypertension Mother    Hypertension Father    Diabetes Father    Hyperlipidemia Father    Heart disease Father    Thyroid  disease Father    COPD Brother    Diabetes Brother    Depression Brother    PE: BP 130/80   Pulse 92   Ht 6' (1.829 m)   Wt 284 lb 12.8 oz (129.2 kg)   SpO2 96%   BMI 38.63 kg/m  Wt Readings from Last 3 Encounters:  07/05/24 284 lb 12.8 oz (129.2 kg)  02/09/24 280 lb (127 kg)  03/31/23 280 lb (127 kg)   Constitutional: overweight, in NAD Eyes: EOMI, no exophthalmos ENT: no thyromegaly, no thyroid  nodules palpable, but + right thyroid  fullness palpated; no cervical lymphadenopathy Cardiovascular: RRR, No MRG Respiratory: CTA B Musculoskeletal: no deformities Skin: no rashes Neurological: no tremor with outstretched hands  ASSESSMENT: 1.  Right thyroid  nodule  PLAN: 1.  Right mid lobe thyroid  nodule -Patient has a history of a right mid thyroid  nodule, without concerning features: Not hypoechoic, without microcalcifications, without internal blood flow, more wider than tall without irregular margins.  The nodule increased o in size on the ultrasound from 09/2021 from 2.7 to 3.3 cm in the largest dimension.  I tried to biopsy this nodule twice, in 11/26/2020 and 04/28/2022 but the results were inconclusive due to scant material.  In that case, we discussed about having 2 options: Right thyroid  lobectomy versus close follow-up.  He opted for follow-up.  We checked another ultrasound on 04/11/2023 and the nodule was considered stable at that time, measuring 4.1  cm.  At that time, we discussed about following this with another ultrasound in 2 years. - However, at today's visit, he returns earlier as he feels that the nodule has grown.  He is now able to feel it when he touches his neck.  Indeed, the nodule is palpable on physical exam.  He continues to not have any neck compression symptoms. - At today's visit, we we will repeat another thyroid  ultrasound.  If the nodule continues to grow, we have the option of continuing to follow it,  but I would probably consider definitive treatment either with right lobectomy or RFA, which is now available locally.  However, I am not sure if we could go ahead with this without a benign biopsy result.  I will check on this.  We did discuss that surgery would be curative also if the nodule is cancerous but there is a 20 to 25% risk of hypothyroidism afterwards and the need to be on levothyroxine permanently.  He would be amenable to this if absolutely needed. - Reviewed latest TSH which was normal in 12/2023.  We will not repeat this today. - I will see the patient back in a year  Orders Placed This Encounter  Procedures   US  THYROID    Lela Fendt, MD PhD [facility] Endocrinology

## 2024-07-05 NOTE — Patient Instructions (Addendum)
Will schedule a new thyroid U/S for you.  You should have an endocrinology follow-up appointment in 1 year.

## 2024-07-08 ENCOUNTER — Inpatient Hospital Stay: Admission: RE | Admit: 2024-07-08 | Discharge: 2024-07-08 | Attending: Internal Medicine | Admitting: Internal Medicine

## 2024-07-08 DIAGNOSIS — C73 Malignant neoplasm of thyroid gland: Secondary | ICD-10-CM | POA: Diagnosis not present

## 2024-07-08 DIAGNOSIS — E041 Nontoxic single thyroid nodule: Secondary | ICD-10-CM

## 2024-07-13 ENCOUNTER — Ambulatory Visit: Payer: Self-pay | Admitting: Internal Medicine

## 2024-07-16 ENCOUNTER — Other Ambulatory Visit: Payer: Self-pay | Admitting: Internal Medicine

## 2024-07-16 DIAGNOSIS — R059 Cough, unspecified: Secondary | ICD-10-CM | POA: Diagnosis not present

## 2024-07-16 DIAGNOSIS — E041 Nontoxic single thyroid nodule: Secondary | ICD-10-CM

## 2024-08-09 NOTE — Progress Notes (Signed)
 Chief Complaint: Patient was seen in consultation today for symptomatic thyroid  nodule  Referring Physician(s): Gherghe,Cristina  History of Present Illness: Terry Delgado is a 58 y.o. male with a medical history significant for atrial fibrillation, HTN and an enlarged right thyroid  nodule which was incidentally discovered on CT in 2020. This nodule has been biopsied twice with pathology inconclusive due to scant material. When first identified the nodule measured 2.2 cm. It has been followed with surveillance imaging and continues to enlarge. The most recent US  07/08/24 now measures the nodule at 4.4 cm.   He followed up with his endocrinologist and the patient has obvious neck enlargement with the nodule palpable on exam. He does not have any compressive symptoms. Dr. Trixie discussed several treatment options including continued surveillance, right lobectomy or RFA. The patient is open to exploring both RFA and surgery.   The patient has been referred to Interventional Radiology for potential radiofrequency ablation and he presents today for further discussion.   Thyroid  Symptom Score: 0-10  Thyroid  Cosmetic Score:  {thyroid  cosmetic score:27407}  Past Medical History:  Diagnosis Date   A-fib (HCC)    Allergy    GERD (gastroesophageal reflux disease)    Hypertension     Past Surgical History:  Procedure Laterality Date   STOMACH SURGERY     infant    Allergies: Penicillins  Medications: Prior to Admission medications   Medication Sig Start Date End Date Taking? Authorizing Provider  aspirin  EC 81 MG tablet Take 81 mg by mouth daily.    [provider]  Chlorpheniramine Maleate (ALLERGY PO) Take by mouth.    [provider]  fexofenadine (ALLEGRA) 180 MG tablet Take 180 mg by mouth daily.    [provider]  Multiple Vitamin (MULTI-VITAMIN DAILY PO) Take by mouth.    [provider]     Family History  Problem Relation Age of  Onset   Cancer Mother    Hypertension Mother    Hypertension Father    Diabetes Father    Hyperlipidemia Father    Heart disease Father    Thyroid  disease Father    COPD Brother    Diabetes Brother    Depression Brother     Social History   Socioeconomic History   Marital status: Married    Spouse name: Not on file   Number of children: 2   Years of education: Not on file   Highest education level: Not on file  Occupational History   Occupation: Chief of staff  Tobacco Use   Smoking status: Never   Smokeless tobacco: Never  Vaping Use   Vaping status: Never Used  Substance and Sexual Activity   Alcohol use: Yes    Alcohol/week: 0.0 standard drinks of alcohol    Comment: Beer and wine-3 drinks weekly   Drug use: No   Sexual activity: Not on file  Other Topics Concern   Not on file  Social History Narrative   Not on file   Social Drivers of Health   Financial Resource Strain: Low Risk  (12/23/2023)   Received from [facility]   Overall Financial Resource Strain (CARDIA)    Difficulty of Paying Living Expenses: Not very hard  Food Insecurity: No Food Insecurity (12/23/2023)   [facility]   Hunger Vital Sign    Within the past 12 months, you worried that your food would run out before you got the money to buy more.: Never true    Within the  past 12 months, the food you bought just didn't last and you didn't have money to get more.: Never true  Transportation Needs: No Transportation Needs (12/23/2023)   Received from [facility]   PRAPARE - Transportation    Lack of Transportation (Medical): No    Lack of Transportation (Non-Medical): No  Physical Activity: Insufficiently Active (12/23/2023)   Received from [facility]   Exercise Vital Sign    On average, how many days per week do you engage in moderate to strenuous exercise (like a brisk walk)?: 2 days    On average, how many minutes do you engage in exercise at this level?: 20 min  Stress:  Stress Concern Present (12/23/2023)   Received from [facility] of [facility]ational [facility] - Occupational Stress Questionnaire    Feeling of Stress : To some extent  Social Connections: Socially Integrated (12/23/2023)   Received from [facility]   Social Network    How would you rate your social network (family, work, friends)?: Good participation with social networks     Review of Systems: A 12 point ROS discussed and pertinent positives are indicated in the HPI above.  All other systems are negative.  Vital Signs: There were no vitals taken for this visit.  Physical Exam  Imaging:  US  Thyroid  07/08/24  4.4 x 3.0 x 2.5 cm. Labeled as Nodule #1 on report.    TFTs Serum TSH 1.85  12/18/23 Serum free T4 n/a Serum T3 n/a Thyroperoxidase Antibody n/a Thyroglobulin Antibody n/a Calcitonin n/a    Prior Thyroid  FNA: 11/16/20: Nodule #1, right mid - Non-diagnostic  04/11/22 - Nodule #1, right mid - Non-diagnostic  Assessment and Plan:  58 year old male with a history of a right thyroid  nodule which has been gradually enlarging over the past 5 years.   Thank you for this interesting consult.  I greatly enjoyed meeting Terry Delgado and look forward to participating in their care.  A copy of this report was sent to the requesting provider on this date.  Ester Sides, MD Pager: [phone]    I spent a total of  30 Minutes   in face to face in clinical consultation, greater than 50% of which was counseling/coordinating care for symptomatic thyroid  nodule.

## 2024-08-11 ENCOUNTER — Other Ambulatory Visit: Payer: Self-pay | Admitting: Interventional Radiology

## 2024-08-11 ENCOUNTER — Inpatient Hospital Stay
Admission: RE | Admit: 2024-08-11 | Discharge: 2024-08-11 | Disposition: A | Source: Ambulatory Visit | Attending: Internal Medicine | Admitting: Internal Medicine

## 2024-08-11 DIAGNOSIS — E041 Nontoxic single thyroid nodule: Secondary | ICD-10-CM | POA: Diagnosis not present

## 2024-08-11 HISTORY — PX: IR RADIOLOGIST EVAL & MGMT: IMG5224

## 2024-08-18 ENCOUNTER — Other Ambulatory Visit (HOSPITAL_COMMUNITY)
Admission: RE | Admit: 2024-08-18 | Discharge: 2024-08-18 | Disposition: A | Discharge status: Home / Self Care | Source: Ambulatory Visit | Attending: Interventional Radiology | Admitting: Interventional Radiology

## 2024-08-18 ENCOUNTER — Inpatient Hospital Stay: Admission: RE | Admit: 2024-08-18 | Discharge: 2024-08-18 | Attending: Interventional Radiology

## 2024-08-18 DIAGNOSIS — E041 Nontoxic single thyroid nodule: Secondary | ICD-10-CM | POA: Diagnosis present

## 2024-08-20 LAB — CYTOLOGY - NON PAP

## 2024-09-06 ENCOUNTER — Other Ambulatory Visit: Payer: Self-pay | Admitting: Interventional Radiology

## 2024-09-06 DIAGNOSIS — E041 Nontoxic single thyroid nodule: Secondary | ICD-10-CM

## 2024-09-24 ENCOUNTER — Inpatient Hospital Stay: Admission: RE | Admit: 2024-09-24 | Source: Ambulatory Visit

## 2024-09-24 ENCOUNTER — Ambulatory Visit
Admission: RE | Admit: 2024-09-24 | Discharge: 2024-09-24 | Disposition: A | Discharge status: Home / Self Care | Source: Ambulatory Visit | Attending: Interventional Radiology | Admitting: Interventional Radiology

## 2024-09-24 DIAGNOSIS — E041 Nontoxic single thyroid nodule: Secondary | ICD-10-CM

## 2025-03-21 ENCOUNTER — Ambulatory Visit: Admitting: Internal Medicine

## 2025-07-05 ENCOUNTER — Ambulatory Visit: Admitting: Internal Medicine
